# Patient Record
Sex: Male | Born: 1970
Health system: Southern US, Community
[De-identification: ages and names within clinical notes are randomized; demographics above are authoritative.]

## PROBLEM LIST (undated history)

## (undated) DIAGNOSIS — S82899A Other fracture of unspecified lower leg, initial encounter for closed fracture: Secondary | ICD-10-CM

## (undated) DIAGNOSIS — F324 Major depressive disorder, single episode, in partial remission: Secondary | ICD-10-CM

## (undated) DIAGNOSIS — R635 Abnormal weight gain: Secondary | ICD-10-CM

## (undated) HISTORY — DX: Abnormal weight gain: R63.5

## (undated) HISTORY — DX: Major depressive disorder, single episode, in partial remission: F32.4

## (undated) HISTORY — DX: Other fracture of unspecified lower leg, initial encounter for closed fracture: S82.899A

---

## 2004-11-10 HISTORY — PX: SHOULDER ARTHROSCOPY: SHX128

## 2011-12-17 ENCOUNTER — Ambulatory Visit: Payer: Self-pay | Admitting: Internal Medicine

## 2012-10-07 IMAGING — US ABDOMEN ULTRASOUND
1 series · 17 of 25 positions shown · non-contrast
Comparison: none

REASON FOR EXAM: complete  abn LFTs
COMMENTS:

[Series 1: abdomen ultrasound · 17 of 83 slices shown]
[im 1/83]
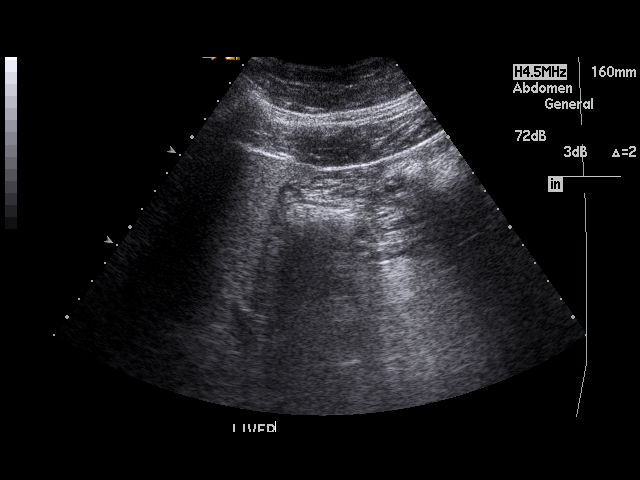
[im 7/83]
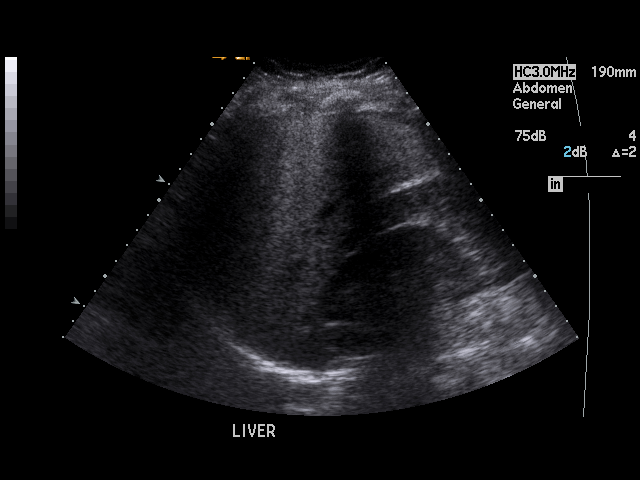
[im 11/83]
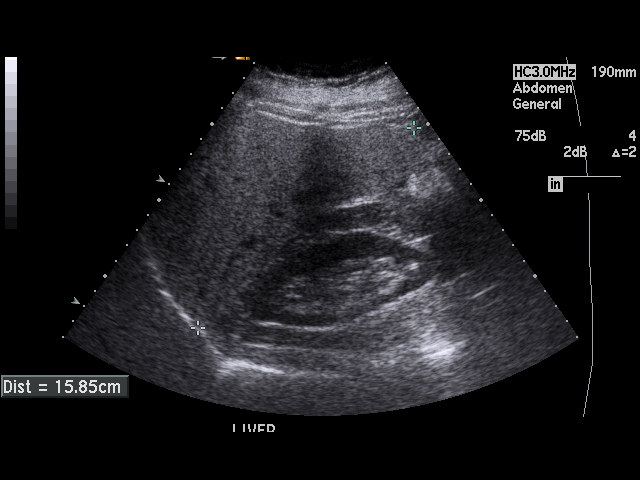
[im 18/83]
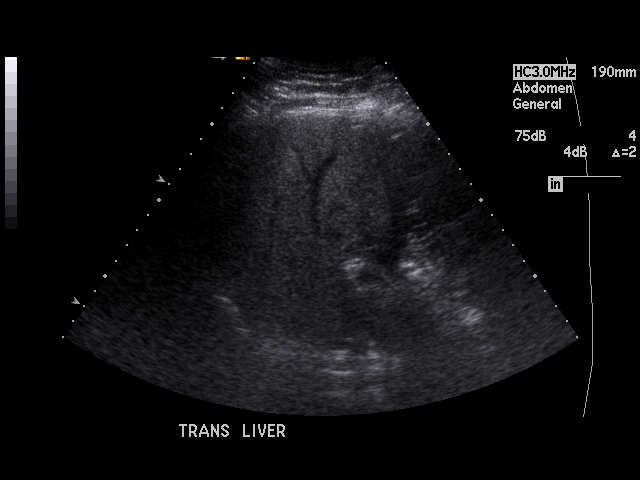
[im 21/83]
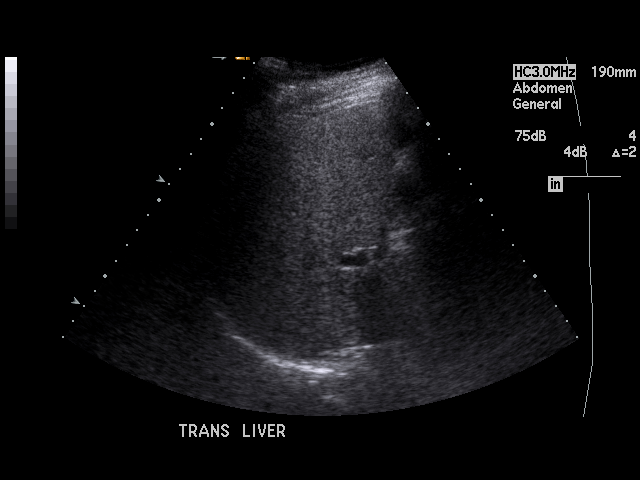
[im 28/83]
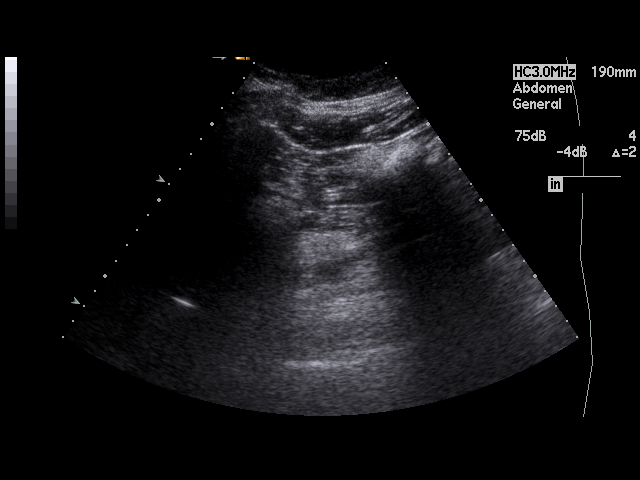
[im 31/83]
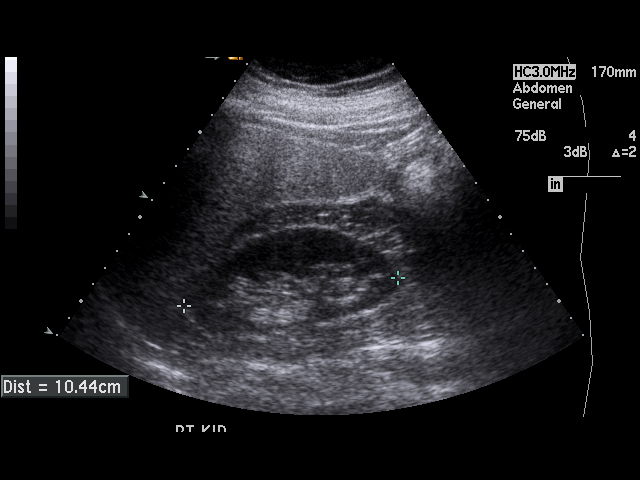
[im 38/83]
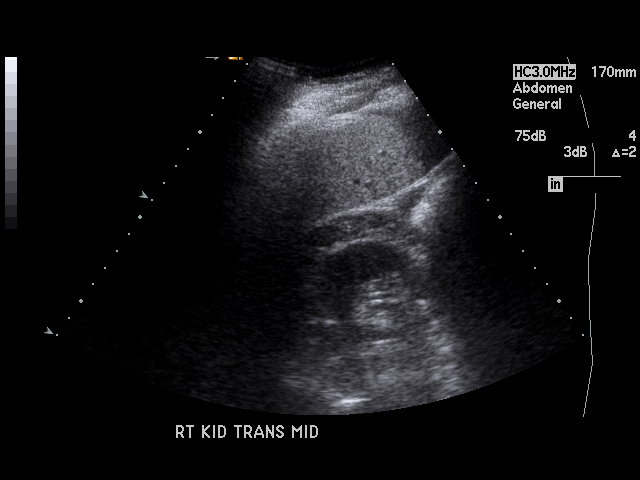
[im 42/83]
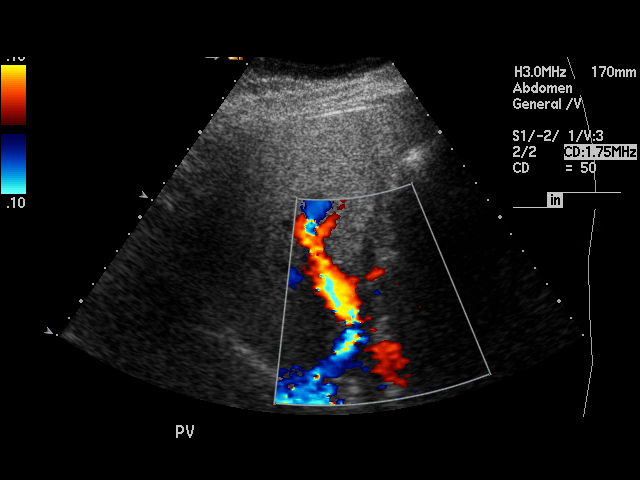
[im 45/83]
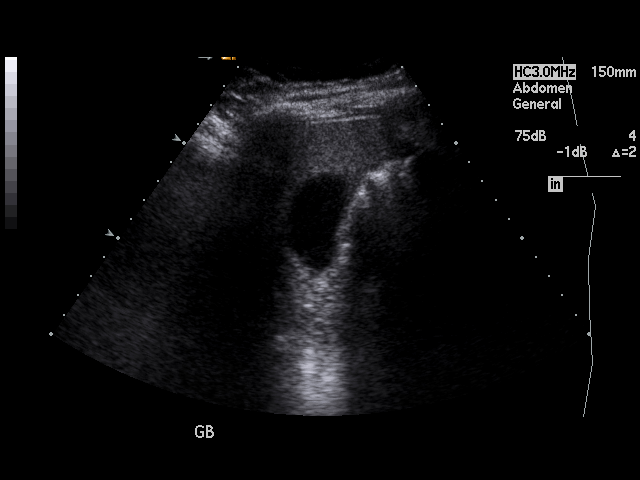
[im 52/83]
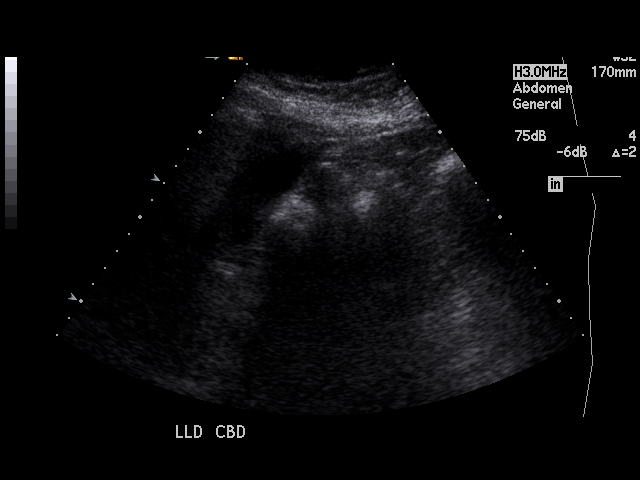
[im 55/83]
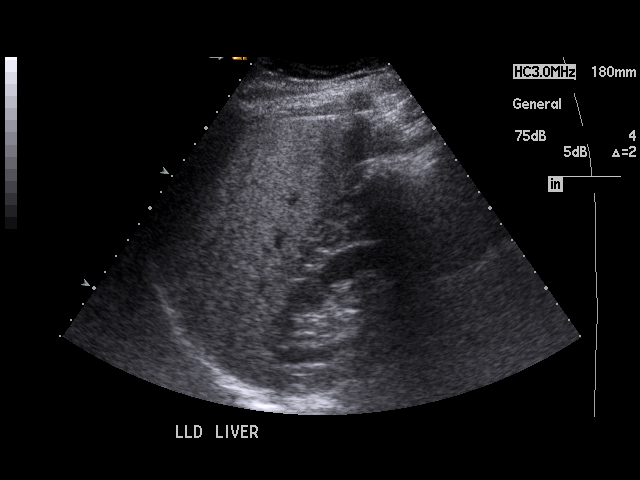
[im 62/83]
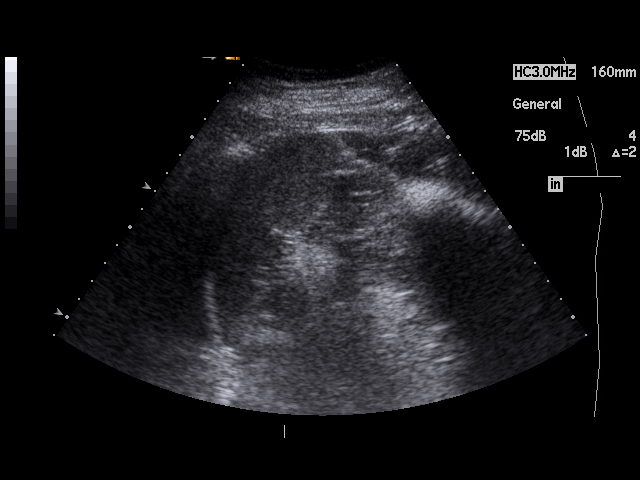
[im 65/83]
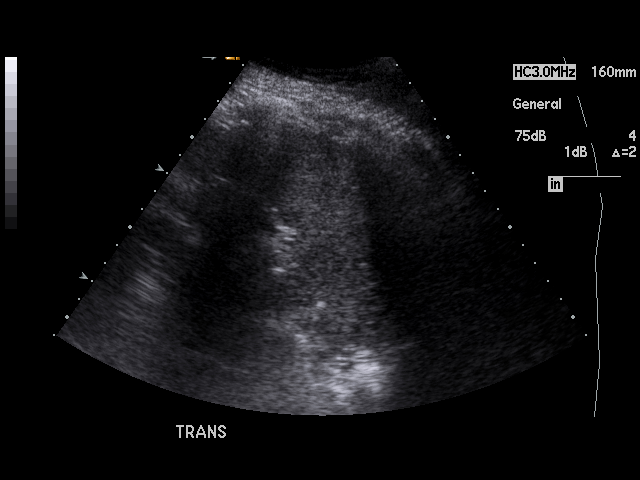
[im 72/83]
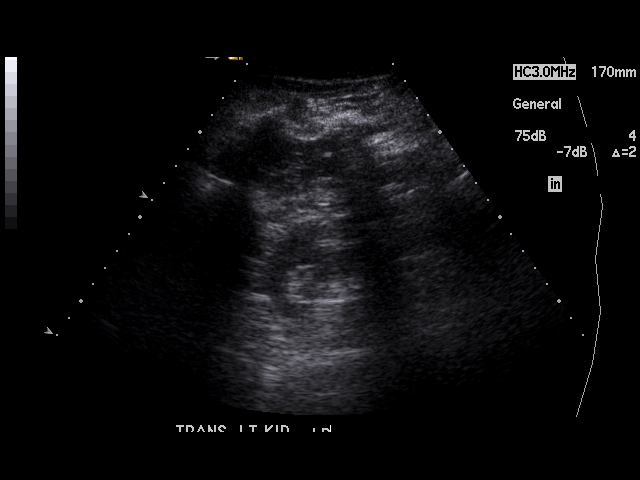
[im 76/83]
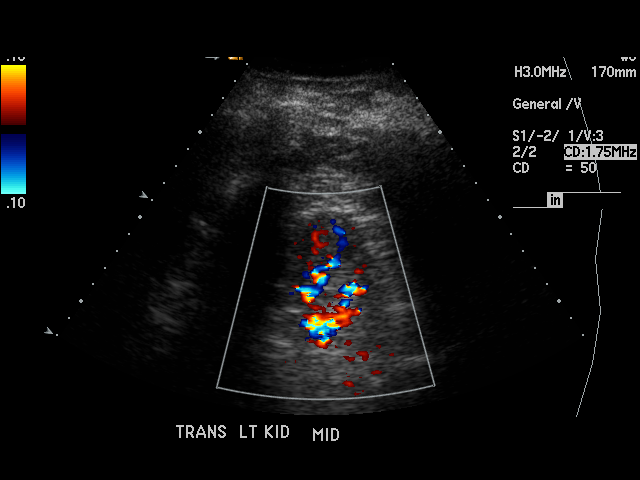
[im 83/83]
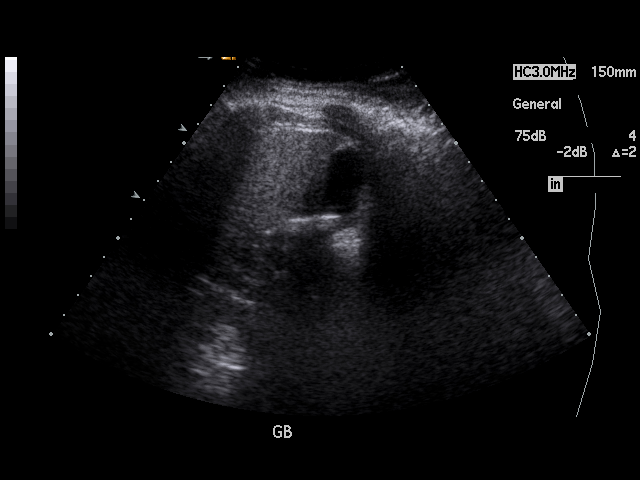

[17 of 25 positions shown; findings below may reference images not displayed]

PROCEDURE:     OSPINA - OSPINA ABDOMEN GENERAL SURVEY  - December 17, 2011 [DATE]

RESULT:     The liver is hyperechogenic consistent with fatty infiltration.
No focal hepatic mass is seen. The body of the pancreas is visualized and is
normal in appearance. The head and tail are obscured by bowel gas. Spleen
size is normal. The abdominal aorta is not visualized adequately for
evaluation. The proximal portion of the inferior vena cava is visualized and
is normal in appearance. No gallstones are seen. There is no thickening of
the gallbladder wall. Common bile duct measures 3.2 mm in diameter which is
within normal limits. The kidneys show no hydronephrosis. No ascites is seen.
IMPRESSION: 1. The liver appears hyperechogenic, suspicious for fatty infiltration.
2. No gallstones are seen.
3. The pancreas is partially obscured.
4. The abdominal aorta is not visualized on this exam and is apparently
obscured by bowel gas.

## 2013-09-27 LAB — BASIC METABOLIC PANEL
BUN: 13 mg/dL (ref 4–21)
CREATININE: 1.2 mg/dL (ref <=1.3)

## 2013-09-27 LAB — LIPID PANEL
CHOLESTEROL: 238 mg/dL — AB (ref 0–200)
HDL: 61 mg/dL (ref 35–70)
Triglycerides: 141 mg/dL (ref 40–160)

## 2013-09-27 LAB — TSH: TSH: 0.9 u[IU]/mL (ref <=5.90)

## 2015-04-11 ENCOUNTER — Telehealth: Payer: Self-pay

## 2015-04-11 DIAGNOSIS — R945 Abnormal results of liver function studies: Secondary | ICD-10-CM | POA: Insufficient documentation

## 2015-04-11 DIAGNOSIS — R635 Abnormal weight gain: Secondary | ICD-10-CM | POA: Insufficient documentation

## 2015-04-11 DIAGNOSIS — F324 Major depressive disorder, single episode, in partial remission: Secondary | ICD-10-CM

## 2015-04-11 DIAGNOSIS — R7989 Other specified abnormal findings of blood chemistry: Secondary | ICD-10-CM | POA: Insufficient documentation

## 2015-04-11 DIAGNOSIS — G4733 Obstructive sleep apnea (adult) (pediatric): Secondary | ICD-10-CM | POA: Insufficient documentation

## 2015-04-11 HISTORY — DX: Abnormal weight gain: R63.5

## 2015-04-11 HISTORY — DX: Major depressive disorder, single episode, in partial remission: F32.4

## 2015-04-11 MED ORDER — CITALOPRAM HYDROBROMIDE 20 MG PO TABS: 20.0000 mg | ORAL_TABLET | Freq: Every day | ORAL | Status: DC

## 2015-04-11 NOTE — Telephone Encounter (Signed)
[facility]

## 2015-05-22 ENCOUNTER — Encounter: Payer: Self-pay | Admitting: Internal Medicine

## 2015-07-03 ENCOUNTER — Encounter: Payer: Self-pay | Admitting: Internal Medicine

## 2015-11-03 ENCOUNTER — Other Ambulatory Visit: Payer: Self-pay | Admitting: Internal Medicine

## 2015-11-11 DIAGNOSIS — S82899A Other fracture of unspecified lower leg, initial encounter for closed fracture: Secondary | ICD-10-CM

## 2015-11-11 HISTORY — DX: Other fracture of unspecified lower leg, initial encounter for closed fracture: S82.899A

## 2016-04-29 ENCOUNTER — Other Ambulatory Visit: Payer: Self-pay | Admitting: Internal Medicine

## 2016-05-05 NOTE — Telephone Encounter (Signed)
Pt is coming in on 10/17 for cpe

## 2016-08-20 ENCOUNTER — Ambulatory Visit (INDEPENDENT_AMBULATORY_CARE_PROVIDER_SITE_OTHER): Payer: Federal, State, Local not specified - PPO | Admitting: Internal Medicine

## 2016-08-20 ENCOUNTER — Encounter: Payer: Self-pay | Admitting: Internal Medicine

## 2016-08-20 VITALS — BP 108/78 | HR 73 | Resp 16 | Ht 66.0 in | Wt 230.0 lb

## 2016-08-20 DIAGNOSIS — Z0000 Encounter for general adult medical examination without abnormal findings: Secondary | ICD-10-CM | POA: Diagnosis not present

## 2016-08-20 DIAGNOSIS — R945 Abnormal results of liver function studies: Secondary | ICD-10-CM

## 2016-08-20 DIAGNOSIS — R7989 Other specified abnormal findings of blood chemistry: Secondary | ICD-10-CM

## 2016-08-20 DIAGNOSIS — G4733 Obstructive sleep apnea (adult) (pediatric): Secondary | ICD-10-CM | POA: Diagnosis not present

## 2016-08-20 DIAGNOSIS — F324 Major depressive disorder, single episode, in partial remission: Secondary | ICD-10-CM

## 2016-08-20 DIAGNOSIS — Z23 Encounter for immunization: Secondary | ICD-10-CM

## 2016-08-20 LAB — POCT URINALYSIS DIPSTICK
BILIRUBIN UA: NEGATIVE
GLUCOSE UA: NEGATIVE
KETONES UA: NEGATIVE
LEUKOCYTES UA: NEGATIVE
NITRITE UA: NEGATIVE
PH UA: 6
Protein, UA: NEGATIVE
RBC UA: NEGATIVE
Spec Grav, UA: >=1.03
Urobilinogen, UA: 0.2

## 2016-08-20 MED ORDER — CITALOPRAM HYDROBROMIDE 20 MG PO TABS: 20.0000 mg | ORAL_TABLET | Freq: Every day | ORAL | 12 refills | Status: DC

## 2016-08-20 NOTE — Progress Notes (Signed)
Date:  08/20/2016   Name:  Bryan Sherman   DOB:  [birth year]   MRN:  [id number]   Chief Complaint: Annual Exam Bryan Sherman is a 45 y.o. male who presents today for his Complete Annual Exam. He feels well. He reports exercising regularly. He reports he is sleeping fairly well. His wife is expecting - @ 11 weeks.  Depression         This is a chronic problem.  The problem has been resolved since onset.  Associated symptoms include no fatigue, no appetite change, no myalgias and no headaches.  Past treatments include SSRIs - Selective serotonin reuptake inhibitors.  Compliance with treatment is good.  Previous treatment provided significant relief.  Sleep Apnea - doing well on CPAP.  Uses it regularly and last reading at 100% compliance.   Review of Systems  Constitutional: Negative for appetite change, chills, diaphoresis, fatigue and unexpected weight change.  HENT: Negative for hearing loss, tinnitus, trouble swallowing and voice change.   Eyes: Negative for visual disturbance.  Respiratory: Negative for choking, shortness of breath and wheezing.   Cardiovascular: Negative for chest pain, palpitations and leg swelling.  Gastrointestinal: Negative for abdominal pain, blood in stool, constipation and diarrhea.  Genitourinary: Negative for difficulty urinating, dysuria and frequency.  Musculoskeletal: Negative for arthralgias, back pain and myalgias.  Skin: Negative for color change and rash.  Neurological: Negative for dizziness, syncope and headaches.  Hematological: Negative for adenopathy.  Psychiatric/Behavioral: Positive for depression. Negative for dysphoric mood and sleep disturbance.    Patient Active Problem List   Diagnosis Date Noted  . Abnormal LFTs 04/11/2015  . Excessive body weight gain 04/11/2015  . Depression, major, single episode, in partial remission (HCC) 04/11/2015  . Obstructive apnea 04/11/2015    Prior to Admission medications   Medication Sig  Start Date End Date Taking? Authorizing Provider  citalopram (CELEXA) 20 MG tablet TAKE ONE TABLET BY MOUTH DAILY 04/29/16  Yes Reubin Milan, MD    No Known Allergies  Past Surgical History:  Procedure Laterality Date  . SHOULDER ARTHROSCOPY Left 2006    Social History  Substance Use Topics  . Smoking status: Former Games developer  . Smokeless tobacco: Current User    Types: Chew  . Alcohol use 2.4 oz/week    4 Standard drinks or equivalent per week     Comment: Social     Medication list has been reviewed and updated.   Physical Exam  Constitutional: He is oriented to person, place, and time. He appears well-developed and well-nourished.  HENT:  Head: Normocephalic.  Right Ear: Tympanic membrane, external ear and ear canal normal.  Left Ear: Tympanic membrane, external ear and ear canal normal.  Nose: Nose normal.  Mouth/Throat: Uvula is midline and oropharynx is clear and moist.  Eyes: Conjunctivae and EOM are normal. Pupils are equal, round, and reactive to light.  Neck: Normal range of motion. Neck supple. Carotid bruit is not present. No thyromegaly present.  Cardiovascular: Normal rate, regular rhythm, normal heart sounds and intact distal pulses.   Pulmonary/Chest: Effort normal and breath sounds normal. He has no wheezes. Right breast exhibits no mass. Left breast exhibits no mass.  Abdominal: Soft. Normal appearance and bowel sounds are normal. There is no hepatosplenomegaly. There is no tenderness.  Musculoskeletal: Normal range of motion.  Lymphadenopathy:    He has no cervical adenopathy.  Neurological: He is alert and oriented to person, place, and time. He has normal reflexes.  Skin: Skin is warm, dry and intact.  Psychiatric: He has a normal mood and affect. His speech is normal and behavior is normal. Judgment and thought content normal.  Nursing note and vitals reviewed.   BP 108/78   Pulse 73   Ht 5\' 6"  (1.676 m)   Wt 221 lb (100.2 kg)   BMI 35.67 kg/m    Assessment and Plan: 1. Annual physical exam Work on exercise and diet for weight loss - Lipid panel - POCT urinalysis dipstick  2. Abnormal LFTs - Comprehensive metabolic panel  3. Depression, major, single episode, in partial remission (HCC) Doing well - TSH - citalopram (CELEXA) 20 MG tablet; Take 1 tablet (20 mg total) by mouth daily.  Dispense: 30 tablet; Refill: 12  4. Obstructive apnea Excellent compliance and response - CBC with Differential/Platelet  5. Need for diphtheria-tetanus-pertussis (Tdap) vaccine - Tdap vaccine greater than or equal to 7yo IM   Bari EdwardLaura Alyia Lacerte, MD [facility]  08/20/2016

## 2016-08-20 NOTE — Patient Instructions (Addendum)
DASH Eating Plan DASH stands for "Dietary Approaches to Stop Hypertension." The DASH eating plan is a healthy eating plan that has been shown to reduce high blood pressure (hypertension). Additional health benefits may include reducing the risk of type 2 diabetes mellitus, heart disease, and stroke. The DASH eating plan may also help with weight loss. WHAT DO I NEED TO KNOW ABOUT THE DASH EATING PLAN? For the DASH eating plan, you will follow these general guidelines:  Choose foods with a percent daily value for sodium of less than 5% (as listed on the food label).  Use salt-free seasonings or herbs instead of table salt or sea salt.  Check with your health care provider or pharmacist before using salt substitutes.  Eat lower-sodium products, often labeled as "lower sodium" or "no salt added."  Eat fresh foods.  Eat more vegetables, fruits, and low-fat dairy products.  Choose whole grains. Look for the word "whole" as the first word in the ingredient list.  Choose fish and skinless chicken or turkey more often than red meat. Limit fish, poultry, and meat to 6 oz (170 g) each day.  Limit sweets, desserts, sugars, and sugary drinks.  Choose heart-healthy fats.  Limit cheese to 1 oz (28 g) per day.  Eat more home-cooked food and less restaurant, buffet, and fast food.  Limit fried foods.  Cook foods using methods other than frying.  Limit canned vegetables. If you do use them, rinse them well to decrease the sodium.  When eating at a restaurant, ask that your food be prepared with less salt, or no salt if possible. WHAT FOODS CAN I EAT? Seek help from a dietitian for individual calorie needs. Grains Whole grain or whole wheat bread. Brown rice. Whole grain or whole wheat pasta. Quinoa, bulgur, and whole grain cereals. Low-sodium cereals. Corn or whole wheat flour tortillas. Whole grain cornbread. Whole grain crackers. Low-sodium crackers. Vegetables Fresh or frozen vegetables  (raw, steamed, roasted, or grilled). Low-sodium or reduced-sodium tomato and vegetable juices. Low-sodium or reduced-sodium tomato sauce and paste. Low-sodium or reduced-sodium canned vegetables.  Fruits All fresh, canned (in natural juice), or frozen fruits. Meat and Other Protein Products Ground beef (85% or leaner), grass-fed beef, or beef trimmed of fat. Skinless chicken or turkey. Ground chicken or turkey. Pork trimmed of fat. All fish and seafood. Eggs. Dried beans, peas, or lentils. Unsalted nuts and seeds. Unsalted canned beans. Dairy Low-fat dairy products, such as skim or 1% milk, 2% or reduced-fat cheeses, low-fat ricotta or cottage cheese, or plain low-fat yogurt. Low-sodium or reduced-sodium cheeses. Fats and Oils Tub margarines without trans fats. Light or reduced-fat mayonnaise and salad dressings (reduced sodium). Avocado. Safflower, olive, or canola oils. Natural peanut or almond butter. Other Unsalted popcorn and pretzels. The items listed above may not be a complete list of recommended foods or beverages. Contact your dietitian for more options. WHAT FOODS ARE NOT RECOMMENDED? Grains White bread. White pasta. White rice. Refined cornbread. Bagels and croissants. Crackers that contain trans fat. Vegetables Creamed or fried vegetables. Vegetables in a cheese sauce. Regular canned vegetables. Regular canned tomato sauce and paste. Regular tomato and vegetable juices. Fruits Dried fruits. Canned fruit in light or heavy syrup. Fruit juice. Meat and Other Protein Products Fatty cuts of meat. Ribs, chicken wings, bacon, sausage, bologna, salami, chitterlings, fatback, hot dogs, bratwurst, and packaged luncheon meats. Salted nuts and seeds. Canned beans with salt. Dairy Whole or 2% milk, cream, half-and-half, and cream cheese. Whole-fat or sweetened yogurt. Full-fat   cheeses or blue cheese. Nondairy creamers and whipped toppings. Processed cheese, cheese spreads, or cheese  curds. Condiments Onion and garlic salt, seasoned salt, table salt, and sea salt. Canned and packaged gravies. Worcestershire sauce. Tartar sauce. Barbecue sauce. Teriyaki sauce. Soy sauce, including reduced sodium. Steak sauce. Fish sauce. Oyster sauce. Cocktail sauce. Horseradish. Ketchup and mustard. Meat flavorings and tenderizers. Bouillon cubes. Hot sauce. Tabasco sauce. Marinades. Taco seasonings. Relishes. Fats and Oils Butter, stick margarine, lard, shortening, ghee, and bacon fat. Coconut, palm kernel, or palm oils. Regular salad dressings. Other Pickles and olives. Salted popcorn and pretzels. The items listed above may not be a complete list of foods and beverages to avoid. Contact your dietitian for more information. WHERE CAN I FIND MORE INFORMATION? National Heart, Lung, and Blood Institute: www.nhlbi.nih.gov/health/health-topics/topics/dash/   This information is not intended to replace advice given to you by your health care provider. Make sure you discuss any questions you have with your health care provider.   Document Released: 10/16/2011 Document Revised: 11/17/2014 Document Reviewed: 08/31/2013 Elsevier Interactive Patient Education 2016 Elsevier Inc. Tdap Vaccine (Tetanus, Diphtheria and Pertussis): What You Need to Know 1. Why get vaccinated? Tetanus, diphtheria and pertussis are very serious diseases. Tdap vaccine can protect us from these diseases. And, Tdap vaccine given to pregnant women can protect newborn babies against pertussis. TETANUS (Lockjaw) is rare in the [place] today. It causes painful muscle tightening and stiffness, usually all over the body.  It can lead to tightening of muscles in the head and neck so you can't open your mouth, swallow, or sometimes even breathe. Tetanus kills about 1 out of 10 people who are infected even after receiving the best medical care. DIPHTHERIA is also rare in the [place] today. It can cause a thick coating to  form in the back of the throat.  It can lead to breathing problems, heart failure, paralysis, and death. PERTUSSIS (Whooping Cough) causes severe coughing spells, which can cause difficulty breathing, vomiting and disturbed sleep.  It can also lead to weight loss, incontinence, and rib fractures. Up to 2 in 100 adolescents and 5 in 100 adults with pertussis are hospitalized or have complications, which could include pneumonia or death. These diseases are caused by bacteria. Diphtheria and pertussis are spread from person to person through secretions from coughing or sneezing. Tetanus enters the body through cuts, scratches, or wounds. Before vaccines, as many as 200,000 cases of diphtheria, 200,000 cases of pertussis, and hundreds of cases of tetanus, were reported in the [place] each year. Since vaccination began, reports of cases for tetanus and diphtheria have dropped by about 99% and for pertussis by about 80%. 2. Tdap vaccine Tdap vaccine can protect adolescents and adults from tetanus, diphtheria, and pertussis. One dose of Tdap is routinely given at age 11 or 12. People who did not get Tdap at that age should get it as soon as possible. Tdap is especially important for healthcare professionals and anyone having close contact with a baby younger than 12 months. Pregnant women should get a dose of Tdap during every pregnancy, to protect the newborn from pertussis. Infants are most at risk for severe, life-threatening complications from pertussis. Another vaccine, called Td, protects against tetanus and diphtheria, but not pertussis. A Td booster should be given every 10 years. Tdap may be given as one of these boosters if you have never gotten Tdap before. Tdap may also be given after a severe cut or burn to prevent tetanus infection.   Your doctor or the person giving you the vaccine can give you more information. Tdap may safely be given at the same time as other vaccines. 3. Some people  should not get this vaccine  A person who has ever had a life-threatening allergic reaction after a previous dose of any diphtheria, tetanus or pertussis containing vaccine, OR has a severe allergy to any part of this vaccine, should not get Tdap vaccine. Tell the person giving the vaccine about any severe allergies.  Anyone who had coma or long repeated seizures within 7 days after a childhood dose of DTP or DTaP, or a previous dose of Tdap, should not get Tdap, unless a cause other than the vaccine was found. They can still get Td.  Talk to your doctor if you:  have seizures or another nervous system problem,  had severe pain or swelling after any vaccine containing diphtheria, tetanus or pertussis,  ever had a condition called Guillain-Barr Syndrome (GBS),  aren't feeling well on the day the shot is scheduled. 4. Risks With any medicine, including vaccines, there is a chance of side effects. These are usually mild and go away on their own. Serious reactions are also possible but are rare. Most people who get Tdap vaccine do not have any problems with it. Mild problems following Tdap (Did not interfere with activities)  Pain where the shot was given (about 3 in 4 adolescents or 2 in 3 adults)  Redness or swelling where the shot was given (about 1 person in 5)  Mild fever of at least 100.4F (up to about 1 in 25 adolescents or 1 in 100 adults)  Headache (about 3 or 4 people in 10)  Tiredness (about 1 person in 3 or 4)  Nausea, vomiting, diarrhea, stomach ache (up to 1 in 4 adolescents or 1 in 10 adults)  Chills, sore joints (about 1 person in 10)  Body aches (about 1 person in 3 or 4)  Rash, swollen glands (uncommon) Moderate problems following Tdap (Interfered with activities, but did not require medical attention)  Pain where the shot was given (up to 1 in 5 or 6)  Redness or swelling where the shot was given (up to about 1 in 16 adolescents or 1 in 12 adults)  Fever  over 102F (about 1 in 100 adolescents or 1 in 250 adults)  Headache (about 1 in 7 adolescents or 1 in 10 adults)  Nausea, vomiting, diarrhea, stomach ache (up to 1 or 3 people in 100)  Swelling of the entire arm where the shot was given (up to about 1 in 500). Severe problems following Tdap (Unable to perform usual activities; required medical attention)  Swelling, severe pain, bleeding and redness in the arm where the shot was given (rare). Problems that could happen after any vaccine:  People sometimes faint after a medical procedure, including vaccination. Sitting or lying down for about 15 minutes can help prevent fainting, and injuries caused by a fall. Tell your doctor if you feel dizzy, or have vision changes or ringing in the ears.  Some people get severe pain in the shoulder and have difficulty moving the arm where a shot was given. This happens very rarely.  Any medication can cause a severe allergic reaction. Such reactions from a vaccine are very rare, estimated at fewer than 1 in a million doses, and would happen within a few minutes to a few hours after the vaccination. As with any medicine, there is a very remote chance of   a vaccine causing a serious injury or death. The safety of vaccines is always being monitored. For more information, visit: www.cdc.gov/vaccinesafety/ 5. What if there is a serious problem? What should I look for?  Look for anything that concerns you, such as signs of a severe allergic reaction, very high fever, or unusual behavior.  Signs of a severe allergic reaction can include hives, swelling of the face and throat, difficulty breathing, a fast heartbeat, dizziness, and weakness. These would usually start a few minutes to a few hours after the vaccination. What should I do?  If you think it is a severe allergic reaction or other emergency that can't wait, call 9-1-1 or get the person to the nearest hospital. Otherwise, call your doctor.  Afterward,  the reaction should be reported to the Vaccine Adverse Event Reporting System (VAERS). Your doctor might file this report, or you can do it yourself through the VAERS web site at www.vaers.hhs.gov, or by calling 1-800-822-7967. VAERS does not give medical advice.  6. The National Vaccine Injury Compensation Program The National Vaccine Injury Compensation Program (VICP) is a federal program that was created to compensate people who may have been injured by certain vaccines. Persons who believe they may have been injured by a vaccine can learn about the program and about filing a claim by calling 1-800-338-2382 or visiting the VICP website at www.hrsa.gov/vaccinecompensation. There is a time limit to file a claim for compensation. 7. How can I learn more?  Ask your doctor. He or she can give you the vaccine package insert or suggest other sources of information.  Call your local or state health department.  Contact the Centers for Disease Control and Prevention (CDC):  Call 1-800-232-4636 (1-800-CDC-INFO) or  Visit CDC's website at www.cdc.gov/vaccines CDC Tdap Vaccine VIS (01/03/14)   This information is not intended to replace advice given to you by your health care provider. Make sure you discuss any questions you have with your health care provider.   Document Released: 04/27/2012 Document Revised: 11/17/2014 Document Reviewed: 02/08/2014 Elsevier Interactive Patient Education 2016 Elsevier Inc.  

## 2016-08-21 LAB — COMPREHENSIVE METABOLIC PANEL
ALT: 56 IU/L — ABNORMAL HIGH (ref 0–44)
AST: 31 IU/L (ref 0–40)
Albumin/Globulin Ratio: 2.1 (ref 1.2–2.2)
Albumin: 4.6 g/dL (ref 3.5–5.5)
Alkaline Phosphatase: 61 IU/L (ref 39–117)
BUN/Creatinine Ratio: 10 (ref 9–20)
BUN: 12 mg/dL (ref 6–24)
Bilirubin Total: 0.5 mg/dL (ref 0.0–1.2)
CALCIUM: 9.1 mg/dL (ref 8.7–10.2)
CO2: 29 mmol/L (ref 18–29)
CREATININE: 1.17 mg/dL (ref 0.76–1.27)
Chloride: 99 mmol/L (ref 96–106)
GFR calc Af Amer: 87 mL/min/{1.73_m2} (ref >59)
GFR, EST NON AFRICAN AMERICAN: 75 mL/min/{1.73_m2} (ref >59)
GLUCOSE: 89 mg/dL (ref 65–99)
Globulin, Total: 2.2 g/dL (ref 1.5–4.5)
Potassium: 4.7 mmol/L (ref 3.5–5.2)
Sodium: 143 mmol/L (ref 134–144)
Total Protein: 6.8 g/dL (ref 6.0–8.5)

## 2016-08-21 LAB — CBC WITH DIFFERENTIAL/PLATELET
BASOS ABS: 0 10*3/uL (ref 0.0–0.2)
Basos: 1 %
EOS (ABSOLUTE): 0.3 10*3/uL (ref 0.0–0.4)
EOS: 4 %
HEMOGLOBIN: 15.9 g/dL (ref 12.6–17.7)
Hematocrit: 45.6 % (ref 37.5–51.0)
IMMATURE GRANULOCYTES: 0 %
Immature Grans (Abs): 0 10*3/uL (ref 0.0–0.1)
LYMPHS ABS: 2.1 10*3/uL (ref 0.7–3.1)
Lymphs: 25 %
MCH: 32.1 pg (ref 26.6–33.0)
MCHC: 34.9 g/dL (ref 31.5–35.7)
MCV: 92 fL (ref 79–97)
Monocytes Absolute: 0.5 10*3/uL (ref 0.1–0.9)
Monocytes: 6 %
NEUTROS PCT: 64 %
Neutrophils Absolute: 5.2 10*3/uL (ref 1.4–7.0)
Platelets: 185 10*3/uL (ref 150–379)
RBC: 4.96 x10E6/uL (ref 4.14–5.80)
RDW: 14 % (ref 12.3–15.4)
WBC: 8.2 10*3/uL (ref 3.4–10.8)

## 2016-08-21 LAB — LIPID PANEL
CHOLESTEROL TOTAL: 193 mg/dL (ref 100–199)
Chol/HDL Ratio: 3.4 ratio units (ref 0.0–5.0)
HDL: 57 mg/dL (ref >39)
LDL CALC: 112 mg/dL — AB (ref 0–99)
TRIGLYCERIDES: 118 mg/dL (ref 0–149)
VLDL CHOLESTEROL CAL: 24 mg/dL (ref 5–40)

## 2016-08-21 LAB — TSH: TSH: 1.02 u[IU]/mL (ref 0.450–4.500)

## 2017-01-06 DIAGNOSIS — E669 Obesity, unspecified: Secondary | ICD-10-CM | POA: Diagnosis not present

## 2017-01-06 DIAGNOSIS — G4733 Obstructive sleep apnea (adult) (pediatric): Secondary | ICD-10-CM | POA: Diagnosis not present

## 2017-01-09 DIAGNOSIS — M791 Myalgia: Secondary | ICD-10-CM | POA: Diagnosis not present

## 2017-01-09 DIAGNOSIS — M542 Cervicalgia: Secondary | ICD-10-CM | POA: Diagnosis not present

## 2017-08-21 ENCOUNTER — Encounter: Payer: Self-pay | Admitting: Internal Medicine

## 2017-08-21 ENCOUNTER — Ambulatory Visit (INDEPENDENT_AMBULATORY_CARE_PROVIDER_SITE_OTHER): Payer: Federal, State, Local not specified - PPO | Admitting: Internal Medicine

## 2017-08-21 VITALS — BP 116/68 | HR 68 | Ht 67.0 in | Wt 234.0 lb

## 2017-08-21 DIAGNOSIS — R945 Abnormal results of liver function studies: Secondary | ICD-10-CM | POA: Diagnosis not present

## 2017-08-21 DIAGNOSIS — G4733 Obstructive sleep apnea (adult) (pediatric): Secondary | ICD-10-CM

## 2017-08-21 DIAGNOSIS — R7989 Other specified abnormal findings of blood chemistry: Secondary | ICD-10-CM

## 2017-08-21 DIAGNOSIS — F324 Major depressive disorder, single episode, in partial remission: Secondary | ICD-10-CM | POA: Diagnosis not present

## 2017-08-21 DIAGNOSIS — Z0000 Encounter for general adult medical examination without abnormal findings: Secondary | ICD-10-CM | POA: Diagnosis not present

## 2017-08-21 LAB — POCT URINALYSIS DIPSTICK
Bilirubin, UA: NEGATIVE
Glucose, UA: NEGATIVE
Ketones, UA: NEGATIVE
Leukocytes, UA: NEGATIVE
NITRITE UA: NEGATIVE
PH UA: 6 (ref 5.0–8.0)
PROTEIN UA: NEGATIVE
RBC UA: NEGATIVE
Spec Grav, UA: 1.025 (ref 1.010–1.025)
UROBILINOGEN UA: 0.2 U/dL

## 2017-08-21 MED ORDER — CITALOPRAM HYDROBROMIDE 20 MG PO TABS: 20.0000 mg | ORAL_TABLET | Freq: Every day | ORAL | 3 refills | Status: DC

## 2017-08-21 NOTE — Patient Instructions (Signed)
DASH Eating Plan DASH stands for "Dietary Approaches to Stop Hypertension." The DASH eating plan is a healthy eating plan that has been shown to reduce high blood pressure (hypertension). It may also reduce your risk for type 2 diabetes, heart disease, and stroke. The DASH eating plan may also help with weight loss. What are tips for following this plan? General guidelines  Avoid eating more than 2,300 mg (milligrams) of salt (sodium) a day. If you have hypertension, you may need to reduce your sodium intake to 1,500 mg a day.  Limit alcohol intake to no more than 1 drink a day for nonpregnant women and 2 drinks a day for men. One drink equals 12 oz of beer, 5 oz of wine, or 1 oz of hard liquor.  Work with your health care provider to maintain a healthy body weight or to lose weight. Ask what an ideal weight is for you.  Get at least 30 minutes of exercise that causes your heart to beat faster (aerobic exercise) most days of the week. Activities may include walking, swimming, or biking.  Work with your health care provider or diet and nutrition specialist (dietitian) to adjust your eating plan to your individual calorie needs. Reading food labels  Check food labels for the amount of sodium per serving. Choose foods with less than 5 percent of the Daily Value of sodium. Generally, foods with less than 300 mg of sodium per serving fit into this eating plan.  To find whole grains, look for the word "whole" as the first word in the ingredient list. Shopping  Buy products labeled as "low-sodium" or "no salt added."  Buy fresh foods. Avoid canned foods and premade or frozen meals. Cooking  Avoid adding salt when cooking. Use salt-free seasonings or herbs instead of table salt or sea salt. Check with your health care provider or pharmacist before using salt substitutes.  Do not fry foods. Cook foods using healthy methods such as baking, boiling, grilling, and broiling instead.  Cook with  heart-healthy oils, such as olive, canola, soybean, or sunflower oil. Meal planning   Eat a balanced diet that includes: ? 5 or more servings of fruits and vegetables each day. At each meal, try to fill half of your plate with fruits and vegetables. ? Up to 6-8 servings of whole grains each day. ? Less than 6 oz of lean meat, poultry, or fish each day. A 3-oz serving of meat is about the same size as a deck of cards. One egg equals 1 oz. ? 2 servings of low-fat dairy each day. ? A serving of nuts, seeds, or beans 5 times each week. ? Heart-healthy fats. Healthy fats called Omega-3 fatty acids are found in foods such as flaxseeds and coldwater fish, like sardines, salmon, and mackerel.  Limit how much you eat of the following: ? Canned or prepackaged foods. ? Food that is high in trans fat, such as fried foods. ? Food that is high in saturated fat, such as fatty meat. ? Sweets, desserts, sugary drinks, and other foods with added sugar. ? Full-fat dairy products.  Do not salt foods before eating.  Try to eat at least 2 vegetarian meals each week.  Eat more home-cooked food and less restaurant, buffet, and fast food.  When eating at a restaurant, ask that your food be prepared with less salt or no salt, if possible. What foods are recommended? The items listed may not be a complete list. Talk with your dietitian about what   dietary choices are best for you. Grains Whole-grain or whole-wheat bread. Whole-grain or whole-wheat pasta. Brown rice. Oatmeal. Quinoa. Bulgur. Whole-grain and low-sodium cereals. Pita bread. Low-fat, low-sodium crackers. Whole-wheat flour tortillas. Vegetables Fresh or frozen vegetables (raw, steamed, roasted, or grilled). Low-sodium or reduced-sodium tomato and vegetable juice. Low-sodium or reduced-sodium tomato sauce and tomato paste. Low-sodium or reduced-sodium canned vegetables. Fruits All fresh, dried, or frozen fruit. Canned fruit in natural juice (without  added sugar). Meat and other protein foods Skinless chicken or turkey. Ground chicken or turkey. Pork with fat trimmed off. Fish and seafood. Egg whites. Dried beans, peas, or lentils. Unsalted nuts, nut butters, and seeds. Unsalted canned beans. Lean cuts of beef with fat trimmed off. Low-sodium, lean deli meat. Dairy Low-fat (1%) or fat-free (skim) milk. Fat-free, low-fat, or reduced-fat cheeses. Nonfat, low-sodium ricotta or cottage cheese. Low-fat or nonfat yogurt. Low-fat, low-sodium cheese. Fats and oils Soft margarine without trans fats. Vegetable oil. Low-fat, reduced-fat, or light mayonnaise and salad dressings (reduced-sodium). Canola, safflower, olive, soybean, and sunflower oils. Avocado. Seasoning and other foods Herbs. Spices. Seasoning mixes without salt. Unsalted popcorn and pretzels. Fat-free sweets. What foods are not recommended? The items listed may not be a complete list. Talk with your dietitian about what dietary choices are best for you. Grains Baked goods made with fat, such as croissants, muffins, or some breads. Dry pasta or rice meal packs. Vegetables Creamed or fried vegetables. Vegetables in a cheese sauce. Regular canned vegetables (not low-sodium or reduced-sodium). Regular canned tomato sauce and paste (not low-sodium or reduced-sodium). Regular tomato and vegetable juice (not low-sodium or reduced-sodium). Pickles. Olives. Fruits Canned fruit in a light or heavy syrup. Fried fruit. Fruit in cream or butter sauce. Meat and other protein foods Fatty cuts of meat. Ribs. Fried meat. Bacon. Sausage. Bologna and other processed lunch meats. Salami. Fatback. Hotdogs. Bratwurst. Salted nuts and seeds. Canned beans with added salt. Canned or smoked fish. Whole eggs or egg yolks. Chicken or turkey with skin. Dairy Whole or 2% milk, cream, and half-and-half. Whole or full-fat cream cheese. Whole-fat or sweetened yogurt. Full-fat cheese. Nondairy creamers. Whipped toppings.  Processed cheese and cheese spreads. Fats and oils Butter. Stick margarine. Lard. Shortening. Ghee. Bacon fat. Tropical oils, such as coconut, palm kernel, or palm oil. Seasoning and other foods Salted popcorn and pretzels. Onion salt, garlic salt, seasoned salt, table salt, and sea salt. Worcestershire sauce. Tartar sauce. Barbecue sauce. Teriyaki sauce. Soy sauce, including reduced-sodium. Steak sauce. Canned and packaged gravies. Fish sauce. Oyster sauce. Cocktail sauce. Horseradish that you find on the shelf. Ketchup. Mustard. Meat flavorings and tenderizers. Bouillon cubes. Hot sauce and Tabasco sauce. Premade or packaged marinades. Premade or packaged taco seasonings. Relishes. Regular salad dressings. Where to find more information:  National Heart, Lung, and Blood Institute: www.nhlbi.nih.gov  American Heart Association: www.heart.org Summary  The DASH eating plan is a healthy eating plan that has been shown to reduce high blood pressure (hypertension). It may also reduce your risk for type 2 diabetes, heart disease, and stroke.  With the DASH eating plan, you should limit salt (sodium) intake to 2,300 mg a day. If you have hypertension, you may need to reduce your sodium intake to 1,500 mg a day.  When on the DASH eating plan, aim to eat more fresh fruits and vegetables, whole grains, lean proteins, low-fat dairy, and heart-healthy fats.  Work with your health care provider or diet and nutrition specialist (dietitian) to adjust your eating plan to your individual   calorie needs. This information is not intended to replace advice given to you by your health care provider. Make sure you discuss any questions you have with your health care provider. Document Released: 10/16/2011 Document Revised: 10/20/2016 Document Reviewed: 10/20/2016 Elsevier Interactive Patient Education  2017 Elsevier Inc.  

## 2017-08-21 NOTE — Progress Notes (Signed)
Date:  08/21/2017   Name:  Bryan Sherman   DOB:  [birth year]   MRN:  [id number]   Chief Complaint: Annual Exam Bryan Sherman is a 46 y.o. male who presents today for his Complete Annual Exam. He feels well. He reports exercising walking the baby but no structured program. He reports he is sleeping well. He has a 5 mo daughter at home.  Depression         This is a chronic problem.  The problem occurs rarely.  Associated symptoms include no fatigue, no appetite change, no myalgias and no headaches.  OSA - still using CPAP nightly and still seeing sleep specialist yearly. He sleeps well and has good energy during the day.  No somnolence or headache.    Review of Systems  Constitutional: Negative for appetite change, chills, diaphoresis, fatigue and unexpected weight change.  HENT: Negative for hearing loss, tinnitus, trouble swallowing and voice change.   Eyes: Negative for visual disturbance.  Respiratory: Negative for choking, shortness of breath and wheezing.   Cardiovascular: Negative for chest pain, palpitations and leg swelling.  Gastrointestinal: Negative for abdominal pain, blood in stool, constipation and diarrhea.  Genitourinary: Negative for difficulty urinating, dysuria and frequency.  Musculoskeletal: Negative for arthralgias, back pain and myalgias.  Skin: Negative for color change and rash.  Neurological: Negative for dizziness, syncope and headaches.  Hematological: Negative for adenopathy.  Psychiatric/Behavioral: Positive for depression. Negative for dysphoric mood and sleep disturbance.    Patient Active Problem List   Diagnosis Date Noted  . Abnormal LFTs 04/11/2015  . Excessive body weight gain 04/11/2015  . Depression, major, single episode, in partial remission (HCC) 04/11/2015  . Obstructive apnea 04/11/2015    Prior to Admission medications   Medication Sig Start Date End Date Taking? Authorizing Provider  citalopram (CELEXA) 20 MG tablet Take  1 tablet (20 mg total) by mouth daily. 08/20/16  Yes Reubin Milan, MD    No Known Allergies  Past Surgical History:  Procedure Laterality Date  . SHOULDER ARTHROSCOPY Left 2006    Social History  Substance Use Topics  . Smoking status: Former Games developer  . Smokeless tobacco: Current User    Types: Chew  . Alcohol use 2.4 oz/week    4 Standard drinks or equivalent per week     Comment: Social     Medication list has been reviewed and updated.  PHQ 2/9 Scores 08/21/2017 08/20/2016  PHQ - 2 Score 0 0  PHQ- 9 Score 0 -    Physical Exam  Constitutional: He is oriented to person, place, and time. He appears well-developed and well-nourished.  HENT:  Head: Normocephalic.  Right Ear: Tympanic membrane, external ear and ear canal normal.  Left Ear: Tympanic membrane, external ear and ear canal normal.  Nose: Nose normal.  Mouth/Throat: Uvula is midline and oropharynx is clear and moist.  Eyes: Pupils are equal, round, and reactive to light. Conjunctivae and EOM are normal.  Neck: Normal range of motion. Neck supple. Carotid bruit is not present. No thyromegaly present.  Cardiovascular: Normal rate, regular rhythm, normal heart sounds and intact distal pulses.   Pulmonary/Chest: Effort normal and breath sounds normal. He has no wheezes. Right breast exhibits no mass. Left breast exhibits no mass.  Abdominal: Soft. Normal appearance and bowel sounds are normal. There is no hepatosplenomegaly. There is no tenderness.  Musculoskeletal: Normal range of motion. He exhibits no edema or tenderness.  Lymphadenopathy:    He has  no cervical adenopathy.  Neurological: He is alert and oriented to person, place, and time. He has normal reflexes.  Skin: Skin is warm, dry and intact.  Psychiatric: He has a normal mood and affect. His speech is normal and behavior is normal. Judgment and thought content normal.  Nursing note and vitals reviewed.   BP 116/68   Pulse 68   Ht  (1.702 m)    Wt 234 lb (106.1 kg)   SpO2 96%   BMI 36.65 kg/m   Assessment and Plan: 1. Annual physical exam Normal exam except for weight Discussed regular exercise and dietary changes - CBC with Differential/Platelet - Lipid panel - POCT urinalysis dipstick  2. Depression, major, single episode, in partial remission (HCC) Doing well on current medication - TSH - citalopram (CELEXA) 20 MG tablet; Take 1 tablet (20 mg total) by mouth daily.  Dispense: 90 tablet; Refill: 3  3. Obstructive apnea Continue CPAP nightly  4. Abnormal LFTs Continue to monitor - Comprehensive metabolic panel  Meds ordered this encounter  Medications  . citalopram (CELEXA) 20 MG tablet    Sig: Take 1 tablet (20 mg total) by mouth daily.    Dispense:  90 tablet    Refill:  3    Partially dictated using Animal nutritionist. Any errors are unintentional.  Bari Edward, MD [facility]  08/21/2017

## 2017-08-23 LAB — LIPID PANEL
CHOLESTEROL TOTAL: 199 mg/dL (ref 100–199)
Chol/HDL Ratio: 3.8 ratio (ref 0.0–5.0)
HDL: 53 mg/dL (ref >39)
LDL CALC: 122 mg/dL — AB (ref 0–99)
TRIGLYCERIDES: 122 mg/dL (ref 0–149)
VLDL Cholesterol Cal: 24 mg/dL (ref 5–40)

## 2017-08-23 LAB — COMPREHENSIVE METABOLIC PANEL
ALK PHOS: 62 IU/L (ref 39–117)
ALT: 44 IU/L (ref 0–44)
AST: 25 IU/L (ref 0–40)
Albumin/Globulin Ratio: 2.3 — ABNORMAL HIGH (ref 1.2–2.2)
Albumin: 4.9 g/dL (ref 3.5–5.5)
BUN/Creatinine Ratio: 12 (ref 9–20)
BUN: 14 mg/dL (ref 6–24)
Bilirubin Total: 0.5 mg/dL (ref 0.0–1.2)
CHLORIDE: 100 mmol/L (ref 96–106)
CO2: 26 mmol/L (ref 20–29)
Calcium: 9.4 mg/dL (ref 8.7–10.2)
Creatinine, Ser: 1.16 mg/dL (ref 0.76–1.27)
GFR calc Af Amer: 87 mL/min/{1.73_m2} (ref >59)
GFR, EST NON AFRICAN AMERICAN: 76 mL/min/{1.73_m2} (ref >59)
GLUCOSE: 96 mg/dL (ref 65–99)
Globulin, Total: 2.1 g/dL (ref 1.5–4.5)
POTASSIUM: 4.7 mmol/L (ref 3.5–5.2)
Sodium: 143 mmol/L (ref 134–144)
TOTAL PROTEIN: 7 g/dL (ref 6.0–8.5)

## 2017-08-23 LAB — CBC WITH DIFFERENTIAL/PLATELET
BASOS ABS: 0 10*3/uL (ref 0.0–0.2)
Basos: 1 %
EOS (ABSOLUTE): 0.4 10*3/uL (ref 0.0–0.4)
Eos: 5 %
Hematocrit: 46.3 % (ref 37.5–51.0)
Hemoglobin: 15.5 g/dL (ref 13.0–17.7)
IMMATURE GRANS (ABS): 0 10*3/uL (ref 0.0–0.1)
IMMATURE GRANULOCYTES: 0 %
LYMPHS: 24 %
Lymphocytes Absolute: 1.8 10*3/uL (ref 0.7–3.1)
MCH: 31.6 pg (ref 26.6–33.0)
MCHC: 33.5 g/dL (ref 31.5–35.7)
MCV: 95 fL (ref 79–97)
MONOS ABS: 0.5 10*3/uL (ref 0.1–0.9)
Monocytes: 6 %
NEUTROS PCT: 64 %
Neutrophils Absolute: 5 10*3/uL (ref 1.4–7.0)
PLATELETS: 168 10*3/uL (ref 150–379)
RBC: 4.9 x10E6/uL (ref 4.14–5.80)
RDW: 13.9 % (ref 12.3–15.4)
WBC: 7.6 10*3/uL (ref 3.4–10.8)

## 2017-08-23 LAB — TSH: TSH: 0.785 u[IU]/mL (ref 0.450–4.500)

## 2017-12-26 DIAGNOSIS — I10 Essential (primary) hypertension: Secondary | ICD-10-CM | POA: Diagnosis not present

## 2017-12-26 DIAGNOSIS — G4733 Obstructive sleep apnea (adult) (pediatric): Secondary | ICD-10-CM | POA: Diagnosis not present

## 2017-12-29 DIAGNOSIS — G4733 Obstructive sleep apnea (adult) (pediatric): Secondary | ICD-10-CM | POA: Diagnosis not present

## 2018-08-24 ENCOUNTER — Ambulatory Visit (INDEPENDENT_AMBULATORY_CARE_PROVIDER_SITE_OTHER): Payer: Federal, State, Local not specified - PPO | Admitting: Internal Medicine

## 2018-08-24 ENCOUNTER — Encounter: Payer: Self-pay | Admitting: Internal Medicine

## 2018-08-24 VITALS — BP 112/74 | HR 70 | Ht 67.0 in | Wt 212.0 lb

## 2018-08-24 DIAGNOSIS — R945 Abnormal results of liver function studies: Secondary | ICD-10-CM | POA: Diagnosis not present

## 2018-08-24 DIAGNOSIS — G4733 Obstructive sleep apnea (adult) (pediatric): Secondary | ICD-10-CM

## 2018-08-24 DIAGNOSIS — F325 Major depressive disorder, single episode, in full remission: Secondary | ICD-10-CM | POA: Diagnosis not present

## 2018-08-24 DIAGNOSIS — R7989 Other specified abnormal findings of blood chemistry: Secondary | ICD-10-CM

## 2018-08-24 DIAGNOSIS — E785 Hyperlipidemia, unspecified: Secondary | ICD-10-CM

## 2018-08-24 DIAGNOSIS — Z0000 Encounter for general adult medical examination without abnormal findings: Secondary | ICD-10-CM | POA: Diagnosis not present

## 2018-08-24 LAB — POCT URINALYSIS DIPSTICK
BILIRUBIN UA: NEGATIVE
Blood, UA: NEGATIVE
GLUCOSE UA: NEGATIVE
Ketones, UA: NEGATIVE
Leukocytes, UA: NEGATIVE
Nitrite, UA: NEGATIVE
Protein, UA: NEGATIVE
Spec Grav, UA: 1.02 (ref 1.010–1.025)
Urobilinogen, UA: 0.2 E.U./dL
pH, UA: 5 (ref 5.0–8.0)

## 2018-08-24 MED ORDER — CITALOPRAM HYDROBROMIDE 20 MG PO TABS: 20.0000 mg | ORAL_TABLET | Freq: Every day | ORAL | 3 refills | Status: DC

## 2018-08-24 NOTE — Progress Notes (Signed)
Date:  08/24/2018   Name:  Bryan Sherman   DOB:  11/29/1970   MRN:  [id number]   Chief Complaint: Annual Exam Bryan Sherman is a 47 y.o. male who presents today for his Complete Annual Exam. He feels well. He reports exercising some and has changed his diet. He reports he is sleeping well. He has been working on diet and has lost about 20 lbs.  Depression         This is a chronic problem.  The problem has been resolved since onset.  Associated symptoms include no fatigue, no appetite change, no myalgias and no headaches.  Past treatments include SSRIs - Selective serotonin reuptake inhibitors.  Previous treatment provided significant relief. OSA - on nightly CPAP with good compliance and restful sleep.  He downloads the info yearly and it is still working well.   Review of Systems  Constitutional: Negative for appetite change, chills, diaphoresis, fatigue and unexpected weight change.  HENT: Negative for hearing loss, tinnitus, trouble swallowing and voice change.   Eyes: Negative for visual disturbance.  Respiratory: Negative for choking, shortness of breath and wheezing.   Cardiovascular: Negative for chest pain, palpitations and leg swelling.  Gastrointestinal: Negative for abdominal pain, blood in stool, constipation and diarrhea.  Genitourinary: Negative for difficulty urinating, dysuria, frequency, hematuria and urgency.  Musculoskeletal: Negative for arthralgias, back pain and myalgias.  Skin: Negative for color change and rash.  Neurological: Negative for dizziness, syncope and headaches.  Hematological: Negative for adenopathy.  Psychiatric/Behavioral: Positive for depression. Negative for dysphoric mood and sleep disturbance.    Patient Active Problem List   Diagnosis Date Noted  . Major depressive disorder, single episode in full remission (HCC) 08/24/2018  . Hyperlipidemia, mild 08/24/2018  . Abnormal LFTs 04/11/2015  . Obstructive apnea 04/11/2015    No  Known Allergies  Past Surgical History:  Procedure Laterality Date  . SHOULDER ARTHROSCOPY Left 2006    Social History   Tobacco Use  . Smoking status: Former Games developer  . Smokeless tobacco: Current User    Types: Chew  Substance Use Topics  . Alcohol use: Yes    Alcohol/week: 4.0 standard drinks    Types: 4 Standard drinks or equivalent per week    Comment: Social  . Drug use: No     Medication list has been reviewed and updated.  Current Meds  Medication Sig  . citalopram (CELEXA) 20 MG tablet Take 1 tablet (20 mg total) by mouth daily.  Marland Kitchen loratadine (CLARITIN) 10 MG tablet Take 10 mg by mouth daily.  . NON FORMULARY CPAP @@17  cm H20    PHQ 2/9 Scores 08/24/2018 08/21/2017 08/20/2016  PHQ - 2 Score 0 0 0  PHQ- 9 Score 0 0 -    Physical Exam  Constitutional: He is oriented to person, place, and time. He appears well-developed and well-nourished.  HENT:  Head: Normocephalic.  Right Ear: Tympanic membrane, external ear and ear canal normal.  Left Ear: Tympanic membrane, external ear and ear canal normal.  Nose: Nose normal.  Mouth/Throat: Uvula is midline and oropharynx is clear and moist.  Eyes: Pupils are equal, round, and reactive to light. Conjunctivae and EOM are normal.  Neck: Normal range of motion. Neck supple. Carotid bruit is not present. No thyromegaly present.  Cardiovascular: Normal rate, regular rhythm, normal heart sounds and intact distal pulses.  Pulmonary/Chest: Effort normal and breath sounds normal. He has no wheezes. Right breast exhibits no mass. Left breast exhibits  no mass.    Abdominal: Soft. Normal appearance and bowel sounds are normal. There is no hepatosplenomegaly. There is no tenderness.  Musculoskeletal: Normal range of motion. He exhibits no edema.  Lymphadenopathy:    He has no cervical adenopathy.  Neurological: He is alert and oriented to person, place, and time. He has normal reflexes.  Skin: Skin is warm, dry and intact.    Psychiatric: He has a normal mood and affect. His speech is normal and behavior is normal. Judgment and thought content normal.  Nursing note and vitals reviewed.   BP 112/74 (BP Location: Right Arm, Patient Position: Sitting, Cuff Size: Large)   Pulse 70   Ht 5\' 7"  (1.702 m)   Wt 212 lb (96.2 kg)   SpO2 98%   BMI 33.20 kg/m   Assessment and Plan: 1. Annual physical exam Doing well with diet and weight loss - POCT urinalysis dipstick  2. Obstructive apnea Excellent CPAP compliance - CBC with Differential/Platelet  3. Major depression, single episode, in complete remission (HCC) Doing well with medication - citalopram (CELEXA) 20 MG tablet; Take 1 tablet (20 mg total) by mouth daily.  Dispense: 90 tablet; Refill: 3  4. Abnormal LFTs - Comprehensive metabolic panel  5. Hyperlipidemia, mild Should improve with diet changes - Lipid panel   Partially dictated using Animal nutritionist. Any errors are unintentional.  Bari Edward, MD [facility]  08/24/2018

## 2018-08-24 NOTE — Patient Instructions (Signed)

## 2018-08-25 LAB — LIPID PANEL
CHOL/HDL RATIO: 3.5 ratio (ref 0.0–5.0)
Cholesterol, Total: 207 mg/dL — ABNORMAL HIGH (ref 100–199)
HDL: 60 mg/dL (ref >39)
LDL CALC: 118 mg/dL — AB (ref 0–99)
Triglycerides: 147 mg/dL (ref 0–149)
VLDL CHOLESTEROL CAL: 29 mg/dL (ref 5–40)

## 2018-08-25 LAB — COMPREHENSIVE METABOLIC PANEL
ALT: 34 IU/L (ref 0–44)
AST: 28 IU/L (ref 0–40)
Albumin/Globulin Ratio: 2.1 (ref 1.2–2.2)
Albumin: 4.8 g/dL (ref 3.5–5.5)
Alkaline Phosphatase: 68 IU/L (ref 39–117)
BUN/Creatinine Ratio: 19 (ref 9–20)
BUN: 19 mg/dL (ref 6–24)
Bilirubin Total: 0.4 mg/dL (ref 0.0–1.2)
CALCIUM: 9.3 mg/dL (ref 8.7–10.2)
CO2: 27 mmol/L (ref 20–29)
Chloride: 96 mmol/L (ref 96–106)
Creatinine, Ser: 1.02 mg/dL (ref 0.76–1.27)
GFR calc Af Amer: 101 mL/min/{1.73_m2} (ref >59)
GFR, EST NON AFRICAN AMERICAN: 88 mL/min/{1.73_m2} (ref >59)
Globulin, Total: 2.3 g/dL (ref 1.5–4.5)
Glucose: 83 mg/dL (ref 65–99)
Potassium: 4.2 mmol/L (ref 3.5–5.2)
Sodium: 143 mmol/L (ref 134–144)
TOTAL PROTEIN: 7.1 g/dL (ref 6.0–8.5)

## 2018-08-25 LAB — CBC WITH DIFFERENTIAL/PLATELET
Basophils Absolute: 0 10*3/uL (ref 0.0–0.2)
Basos: 0 %
EOS (ABSOLUTE): 0.2 10*3/uL (ref 0.0–0.4)
Eos: 3 %
Hematocrit: 45.4 % (ref 37.5–51.0)
Hemoglobin: 15.2 g/dL (ref 13.0–17.7)
Immature Grans (Abs): 0 10*3/uL (ref 0.0–0.1)
Immature Granulocytes: 0 %
Lymphocytes Absolute: 1.8 10*3/uL (ref 0.7–3.1)
Lymphs: 24 %
MCH: 31.6 pg (ref 26.6–33.0)
MCHC: 33.5 g/dL (ref 31.5–35.7)
MCV: 94 fL (ref 79–97)
Monocytes Absolute: 0.5 10*3/uL (ref 0.1–0.9)
Monocytes: 7 %
Neutrophils Absolute: 4.8 10*3/uL (ref 1.4–7.0)
Neutrophils: 66 %
Platelets: 169 10*3/uL (ref 150–450)
RBC: 4.81 x10E6/uL (ref 4.14–5.80)
RDW: 14 % (ref 12.3–15.4)
WBC: 7.2 10*3/uL (ref 3.4–10.8)

## 2019-01-15 DIAGNOSIS — F341 Dysthymic disorder: Secondary | ICD-10-CM | POA: Diagnosis not present

## 2019-01-15 DIAGNOSIS — G4733 Obstructive sleep apnea (adult) (pediatric): Secondary | ICD-10-CM | POA: Diagnosis not present

## 2019-01-15 DIAGNOSIS — E669 Obesity, unspecified: Secondary | ICD-10-CM | POA: Diagnosis not present

## 2019-01-18 DIAGNOSIS — G4733 Obstructive sleep apnea (adult) (pediatric): Secondary | ICD-10-CM | POA: Diagnosis not present

## 2019-04-28 DIAGNOSIS — E669 Obesity, unspecified: Secondary | ICD-10-CM | POA: Diagnosis not present

## 2019-04-28 DIAGNOSIS — G4733 Obstructive sleep apnea (adult) (pediatric): Secondary | ICD-10-CM | POA: Diagnosis not present

## 2019-04-28 DIAGNOSIS — F329 Major depressive disorder, single episode, unspecified: Secondary | ICD-10-CM | POA: Diagnosis not present

## 2019-08-18 DIAGNOSIS — G4733 Obstructive sleep apnea (adult) (pediatric): Secondary | ICD-10-CM | POA: Diagnosis not present

## 2019-08-29 ENCOUNTER — Ambulatory Visit (INDEPENDENT_AMBULATORY_CARE_PROVIDER_SITE_OTHER): Payer: Federal, State, Local not specified - PPO | Admitting: Internal Medicine

## 2019-08-29 ENCOUNTER — Other Ambulatory Visit: Payer: Self-pay

## 2019-08-29 ENCOUNTER — Encounter: Payer: Self-pay | Admitting: Internal Medicine

## 2019-08-29 VITALS — BP 112/72 | HR 70 | Ht 67.0 in | Wt 237.0 lb

## 2019-08-29 DIAGNOSIS — F325 Major depressive disorder, single episode, in full remission: Secondary | ICD-10-CM | POA: Diagnosis not present

## 2019-08-29 DIAGNOSIS — G4733 Obstructive sleep apnea (adult) (pediatric): Secondary | ICD-10-CM | POA: Diagnosis not present

## 2019-08-29 DIAGNOSIS — E785 Hyperlipidemia, unspecified: Secondary | ICD-10-CM | POA: Diagnosis not present

## 2019-08-29 DIAGNOSIS — Z0000 Encounter for general adult medical examination without abnormal findings: Secondary | ICD-10-CM | POA: Diagnosis not present

## 2019-08-29 LAB — POCT URINALYSIS DIPSTICK
Bilirubin, UA: NEGATIVE
Blood, UA: NEGATIVE
Glucose, UA: NEGATIVE
Ketones, UA: NEGATIVE
Leukocytes, UA: NEGATIVE
Nitrite, UA: NEGATIVE
Protein, UA: NEGATIVE
Spec Grav, UA: 1.02 (ref 1.010–1.025)
Urobilinogen, UA: 0.2 E.U./dL
pH, UA: 6 (ref 5.0–8.0)

## 2019-08-29 MED ORDER — CITALOPRAM HYDROBROMIDE 20 MG PO TABS: 20.0000 mg | ORAL_TABLET | Freq: Every day | ORAL | 3 refills | Status: AC

## 2019-08-29 NOTE — Patient Instructions (Signed)

## 2019-08-29 NOTE — Progress Notes (Signed)
Date:  08/29/2019   Name:  Bryan Sherman   DOB:  1971/07/26   MRN:  [id number]   Chief Complaint: Annual Exam Saleem Coccia Mcmeen is a 48 y.o. male who presents today for his Complete Annual Exam. He feels well. He reports exercising rarely but he is working on his diet for weight loss. He reports he is sleeping well.   Depression        This is a recurrent problem.The problem is unchanged.  Associated symptoms include no fatigue, no appetite change, no myalgias and no headaches.  Past treatments include SSRIs - Selective serotonin reuptake inhibitors.  Compliance with treatment is good. OSA - on CPAP nightly, wakes refreshed, no morning headaches.  Lab Results  Component Value Date   CREATININE 1.02 08/24/2018   BUN 19 08/24/2018   NA 143 08/24/2018   K 4.2 08/24/2018   CL 96 08/24/2018   CO2 27 08/24/2018   Lab Results  Component Value Date   CHOL 207 (H) 08/24/2018   HDL 60 08/24/2018   LDLCALC 118 (H) 08/24/2018   TRIG 147 08/24/2018   CHOLHDL 3.5 08/24/2018   Lab Results  Component Value Date   WBC 7.2 08/24/2018   HGB 15.2 08/24/2018   HCT 45.4 08/24/2018   MCV 94 08/24/2018   PLT 169 08/24/2018     Review of Systems  Constitutional: Negative for appetite change, chills, diaphoresis, fatigue and unexpected weight change.  HENT: Negative for hearing loss, tinnitus, trouble swallowing and voice change.   Eyes: Negative for visual disturbance.  Respiratory: Negative for choking, shortness of breath and wheezing.   Cardiovascular: Negative for chest pain, palpitations and leg swelling.  Gastrointestinal: Negative for abdominal pain, blood in stool, constipation and diarrhea.  Genitourinary: Negative for difficulty urinating, dysuria, frequency and urgency.  Musculoskeletal: Negative for arthralgias, back pain and myalgias.  Skin: Negative for color change and rash.  Allergic/Immunologic: Negative for environmental allergies and food allergies.  Neurological:  Negative for dizziness, syncope and headaches.  Hematological: Negative for adenopathy.  Psychiatric/Behavioral: Positive for depression. Negative for dysphoric mood and sleep disturbance.    Patient Active Problem List   Diagnosis Date Noted  . Major depressive disorder, single episode in full remission (HCC) 08/24/2018  . Hyperlipidemia, mild 08/24/2018  . Abnormal LFTs 04/11/2015  . Obstructive apnea 04/11/2015    No Known Allergies  Past Surgical History:  Procedure Laterality Date  . SHOULDER ARTHROSCOPY Left 2006    Social History   Tobacco Use  . Smoking status: Former Games developer  . Smokeless tobacco: Current User    Types: Chew  Substance Use Topics  . Alcohol use: Yes    Alcohol/week: 4.0 standard drinks    Types: 4 Standard drinks or equivalent per week    Comment: Social  . Drug use: No     Medication list has been reviewed and updated.  Current Meds  Medication Sig  . citalopram (CELEXA) 20 MG tablet Take 1 tablet (20 mg total) by mouth daily.  Marland Kitchen loratadine (CLARITIN) 10 MG tablet Take 10 mg by mouth daily.  . NON FORMULARY CPAP @@17  cm H20    PHQ 2/9 Scores 08/29/2019 08/24/2018 08/21/2017 08/20/2016  PHQ - 2 Score 0 0 0 0  PHQ- 9 Score - 0 0 -    BP Readings from Last 3 Encounters:  08/29/19 112/72  08/24/18 112/74  08/21/17 116/68    Physical Exam Vitals signs and nursing note reviewed.  Constitutional:  General: He is not in acute distress.    Appearance: Normal appearance. He is well-developed.  HENT:     Head: Normocephalic and atraumatic.     Right Ear: Tympanic membrane, ear canal and external ear normal.     Left Ear: Tympanic membrane, ear canal and external ear normal.     Nose: Nose normal.     Mouth/Throat:     Pharynx: Uvula midline.  Eyes:     Conjunctiva/sclera: Conjunctivae normal.     Pupils: Pupils are equal, round, and reactive to light.  Neck:     Musculoskeletal: Normal range of motion and neck supple.      Thyroid: No thyromegaly.     Vascular: No carotid bruit.  Cardiovascular:     Rate and Rhythm: Normal rate and regular rhythm.     Pulses: Normal pulses.     Heart sounds: Normal heart sounds. No murmur.  Pulmonary:     Effort: Pulmonary effort is normal. No respiratory distress.     Breath sounds: Normal breath sounds. No wheezing.  Chest:     Breasts:        Right: No mass.        Left: No mass.  Abdominal:     General: Bowel sounds are normal.     Palpations: Abdomen is soft.     Tenderness: There is no abdominal tenderness.  Musculoskeletal: Normal range of motion.     Right lower leg: No edema.     Left lower leg: No edema.  Lymphadenopathy:     Cervical: No cervical adenopathy.  Skin:    General: Skin is warm and dry.     Capillary Refill: Capillary refill takes less than 2 seconds.     Findings: No rash.  Neurological:     General: No focal deficit present.     Mental Status: He is alert and oriented to person, place, and time.     Deep Tendon Reflexes: Reflexes are normal and symmetric.  Psychiatric:        Speech: Speech normal.        Behavior: Behavior normal.        Thought Content: Thought content normal.        Judgment: Judgment normal.     Wt Readings from Last 3 Encounters:  08/29/19 237 lb (107.5 kg)  08/24/18 212 lb (96.2 kg)  08/21/17 234 lb (106.1 kg)    BP 112/72   Pulse 70   Ht 5\' 7"  (1.702 m)   Wt 237 lb (107.5 kg)   SpO2 96%   BMI 37.12 kg/m   Assessment and Plan: 1. Annual physical exam Normal exam except for weight Continue to work on diet - CBC with Differential/Platelet - Comprehensive metabolic panel - POCT urinalysis dipstick  2. Major depressive disorder, single episode in full remission ([place]) Clinically doing very well on current therapy Will continue same dose - citalopram (CELEXA) 20 MG tablet; Take 1 tablet (20 mg total) by mouth daily.  Dispense: 90 tablet; Refill: 3  3. Hyperlipidemia, mild Check labs and advise  - Lipid panel  4. Obstructive apnea Excellent CPAP compliance and response Continue nightly   Partially dictated using Editor, commissioning. Any errors are unintentional.  Halina Maidens, MD [facility]  08/29/2019

## 2019-08-30 LAB — COMPREHENSIVE METABOLIC PANEL
ALT: 64 IU/L — ABNORMAL HIGH (ref 0–44)
AST: 38 IU/L (ref 0–40)
Albumin/Globulin Ratio: 2.2 (ref 1.2–2.2)
Albumin: 4.4 g/dL (ref 4.0–5.0)
Alkaline Phosphatase: 65 IU/L (ref 39–117)
BUN/Creatinine Ratio: 12 (ref 9–20)
BUN: 13 mg/dL (ref 6–24)
Bilirubin Total: 0.3 mg/dL (ref 0.0–1.2)
CO2: 26 mmol/L (ref 20–29)
Calcium: 9 mg/dL (ref 8.7–10.2)
Chloride: 101 mmol/L (ref 96–106)
Creatinine, Ser: 1.06 mg/dL (ref 0.76–1.27)
GFR calc Af Amer: 96 mL/min/{1.73_m2} (ref >59)
GFR calc non Af Amer: 83 mL/min/{1.73_m2} (ref >59)
Globulin, Total: 2 g/dL (ref 1.5–4.5)
Glucose: 99 mg/dL (ref 65–99)
Potassium: 4.5 mmol/L (ref 3.5–5.2)
Sodium: 144 mmol/L (ref 134–144)
Total Protein: 6.4 g/dL (ref 6.0–8.5)

## 2019-08-30 LAB — CBC WITH DIFFERENTIAL/PLATELET
Basophils Absolute: 0.1 10*3/uL (ref 0.0–0.2)
Basos: 1 %
EOS (ABSOLUTE): 0.3 10*3/uL (ref 0.0–0.4)
Eos: 4 %
Hematocrit: 42.9 % (ref 37.5–51.0)
Hemoglobin: 15 g/dL (ref 13.0–17.7)
Immature Grans (Abs): 0 10*3/uL (ref 0.0–0.1)
Immature Granulocytes: 1 %
Lymphocytes Absolute: 1.9 10*3/uL (ref 0.7–3.1)
Lymphs: 29 %
MCH: 32.8 pg (ref 26.6–33.0)
MCHC: 35 g/dL (ref 31.5–35.7)
MCV: 94 fL (ref 79–97)
Monocytes Absolute: 0.5 10*3/uL (ref 0.1–0.9)
Monocytes: 7 %
Neutrophils Absolute: 3.8 10*3/uL (ref 1.4–7.0)
Neutrophils: 58 %
Platelets: 156 10*3/uL (ref 150–450)
RBC: 4.57 x10E6/uL (ref 4.14–5.80)
RDW: 12.6 % (ref 11.6–15.4)
WBC: 6.6 10*3/uL (ref 3.4–10.8)

## 2019-08-30 LAB — LIPID PANEL
Chol/HDL Ratio: 4 ratio (ref 0.0–5.0)
Cholesterol, Total: 204 mg/dL — ABNORMAL HIGH (ref 100–199)
HDL: 51 mg/dL (ref >39)
LDL Chol Calc (NIH): 124 mg/dL — ABNORMAL HIGH (ref 0–99)
Triglycerides: 162 mg/dL — ABNORMAL HIGH (ref 0–149)
VLDL Cholesterol Cal: 29 mg/dL (ref 5–40)

## 2019-09-08 DIAGNOSIS — D485 Neoplasm of uncertain behavior of skin: Secondary | ICD-10-CM | POA: Diagnosis not present

## 2019-09-19 DIAGNOSIS — G4733 Obstructive sleep apnea (adult) (pediatric): Secondary | ICD-10-CM | POA: Diagnosis not present

## 2019-10-19 DIAGNOSIS — G4733 Obstructive sleep apnea (adult) (pediatric): Secondary | ICD-10-CM | POA: Diagnosis not present

## 2019-12-22 DIAGNOSIS — G4733 Obstructive sleep apnea (adult) (pediatric): Secondary | ICD-10-CM | POA: Diagnosis not present

## 2020-01-10 ENCOUNTER — Other Ambulatory Visit: Payer: Self-pay

## 2020-01-10 ENCOUNTER — Encounter: Payer: Self-pay | Admitting: Internal Medicine

## 2020-01-10 ENCOUNTER — Ambulatory Visit: Payer: Federal, State, Local not specified - PPO | Admitting: Internal Medicine

## 2020-01-10 VITALS — BP 108/68 | HR 87 | Temp 97.1°F | Ht 67.0 in | Wt 245.0 lb

## 2020-01-10 DIAGNOSIS — F325 Major depressive disorder, single episode, in full remission: Secondary | ICD-10-CM

## 2020-01-10 DIAGNOSIS — F5101 Primary insomnia: Secondary | ICD-10-CM | POA: Diagnosis not present

## 2020-01-10 NOTE — Progress Notes (Signed)
Date:  01/10/2020   Name:  Bryan Sherman   DOB:  1971-06-25   MRN:  [id number]   Chief Complaint: Insomnia (X 3 weeks been having issues sleeping at night. Sleeping at most 3 hours at night. Cannot fall asleep. Stays up til 2-3 in the morning and has to wake up at 5am for work.) and Depression (Follow up. Takes Celexa. )  Insomnia Primary symptoms: sleep disturbance.  The current episode started 1 to 4 weeks ago. The onset quality is sudden. The problem occurs nightly. The problem is unchanged. How many beverages per day that contain caffeine: 0 - 1.  Past treatments include alcohol and medication. The treatment provided no relief. How long after going to bed to you fall asleep: over an hour.   PMH includes: depression, no family stress or anxiety, no restless leg syndrome, no work related stressors, no chronic pain. Prior diagnostic workup includes:  No prior workup.  Depression        This is a chronic problem.  The problem has been resolved since onset.  Associated symptoms include fatigue and insomnia.  Associated symptoms include no headaches.  Past treatments include SSRIs - Selective serotonin reuptake inhibitors.   Lab Results  Component Value Date   CREATININE 1.06 08/29/2019   BUN 13 08/29/2019   NA 144 08/29/2019   K 4.5 08/29/2019   CL 101 08/29/2019   CO2 26 08/29/2019   Lab Results  Component Value Date   CHOL 204 (H) 08/29/2019   HDL 51 08/29/2019   LDLCALC 124 (H) 08/29/2019   TRIG 162 (H) 08/29/2019   CHOLHDL 4.0 08/29/2019   Lab Results  Component Value Date   TSH 0.785 08/21/2017   No results found for: HGBA1C   Review of Systems  Constitutional: Positive for fatigue. Negative for chills and fever.  Respiratory: Negative for chest tightness and shortness of breath.   Cardiovascular: Negative for chest pain and palpitations.  Musculoskeletal: Negative for arthralgias.  Neurological: Negative for dizziness and headaches.  Psychiatric/Behavioral:  Positive for depression and sleep disturbance. Negative for dysphoric mood. The patient has insomnia. The patient is not nervous/anxious.     Patient Active Problem List   Diagnosis Date Noted  . Major depressive disorder, single episode in full remission ([place]) 08/24/2018  . Hyperlipidemia, mild 08/24/2018  . Abnormal LFTs 04/11/2015  . Obstructive apnea 04/11/2015    No Known Allergies  Past Surgical History:  Procedure Laterality Date  . SHOULDER ARTHROSCOPY Left 2006    Social History   Tobacco Use  . Smoking status: Former Research scientist (life sciences)  . Smokeless tobacco: Current User    Types: Chew  Substance Use Topics  . Alcohol use: Yes    Alcohol/week: 4.0 standard drinks    Types: 4 Standard drinks or equivalent per week    Comment: Social  . Drug use: No     Medication list has been reviewed and updated.  Current Meds  Medication Sig  . citalopram (CELEXA) 20 MG tablet Take 1 tablet (20 mg total) by mouth daily.  Marland Kitchen loratadine (CLARITIN) 10 MG tablet Take 10 mg by mouth daily.  . NON FORMULARY CPAP @@17  cm H20    PHQ 2/9 Scores 01/10/2020 08/29/2019 08/24/2018 08/21/2017  PHQ - 2 Score 1 0 0 0  PHQ- 9 Score 7 - 0 0    BP Readings from Last 3 Encounters:  01/10/20 108/68  08/29/19 112/72  08/24/18 112/74    Physical Exam Vitals and nursing  note reviewed.  Constitutional:      General: He is not in acute distress.    Appearance: He is well-developed.  HENT:     Head: Normocephalic and atraumatic.  Cardiovascular:     Rate and Rhythm: Normal rate and regular rhythm.     Pulses: Normal pulses.  Pulmonary:     Effort: Pulmonary effort is normal. No respiratory distress.     Breath sounds: No wheezing or rhonchi.  Musculoskeletal:     Cervical back: Normal range of motion.  Lymphadenopathy:     Cervical: No cervical adenopathy.  Skin:    General: Skin is warm and dry.     Findings: No rash.  Neurological:     Mental Status: He is alert and oriented to person,  place, and time.  Psychiatric:        Attention and Perception: Attention normal.        Mood and Affect: Mood normal.        Behavior: Behavior normal.        Thought Content: Thought content normal.     Wt Readings from Last 3 Encounters:  01/10/20 245 lb (111.1 kg)  08/29/19 237 lb (107.5 kg)  08/24/18 212 lb (96.2 kg)    BP 108/68   Pulse 87   Temp (!) 97.1 F (36.2 C) (Temporal)   Ht 5\' 7"  (1.702 m)   Wt 245 lb (111.1 kg)   SpO2 96%   BMI 38.37 kg/m   Assessment and Plan: 1. Primary insomnia Unclear reasons for onset Will give Belsomra sample 20 mg - call for Rx if helpful If not beneficial, will try Trazodone  2. Major depressive disorder, single episode in full remission (HCC) Clinically stable on current regimen with good control of symptoms, No SI or HI. Will continue current therapy with Celexa 20 mg daily.    Partially dictated using . Any errors are unintentional.  Animal nutritionist, MD [facility]  01/10/2020

## 2020-05-03 DIAGNOSIS — E669 Obesity, unspecified: Secondary | ICD-10-CM | POA: Diagnosis not present

## 2020-05-03 DIAGNOSIS — G4733 Obstructive sleep apnea (adult) (pediatric): Secondary | ICD-10-CM | POA: Diagnosis not present

## 2020-08-20 DIAGNOSIS — L0591 Pilonidal cyst without abscess: Secondary | ICD-10-CM | POA: Diagnosis not present

## 2020-08-24 DIAGNOSIS — F419 Anxiety disorder, unspecified: Secondary | ICD-10-CM | POA: Diagnosis not present

## 2020-08-24 DIAGNOSIS — F32A Depression, unspecified: Secondary | ICD-10-CM | POA: Diagnosis not present

## 2020-08-24 DIAGNOSIS — G4733 Obstructive sleep apnea (adult) (pediatric): Secondary | ICD-10-CM | POA: Diagnosis not present

## 2020-08-24 DIAGNOSIS — L0591 Pilonidal cyst without abscess: Secondary | ICD-10-CM | POA: Diagnosis not present

## 2020-08-24 DIAGNOSIS — Z720 Tobacco use: Secondary | ICD-10-CM | POA: Diagnosis not present

## 2020-08-29 ENCOUNTER — Encounter: Payer: Federal, State, Local not specified - PPO | Admitting: Internal Medicine

## 2020-10-31 DIAGNOSIS — G4733 Obstructive sleep apnea (adult) (pediatric): Secondary | ICD-10-CM | POA: Diagnosis not present

## 2020-12-01 DIAGNOSIS — G4733 Obstructive sleep apnea (adult) (pediatric): Secondary | ICD-10-CM | POA: Diagnosis not present

## 2021-01-01 DIAGNOSIS — G4733 Obstructive sleep apnea (adult) (pediatric): Secondary | ICD-10-CM | POA: Diagnosis not present

## 2021-01-29 DIAGNOSIS — G4733 Obstructive sleep apnea (adult) (pediatric): Secondary | ICD-10-CM | POA: Diagnosis not present

## 2021-03-01 DIAGNOSIS — G4733 Obstructive sleep apnea (adult) (pediatric): Secondary | ICD-10-CM | POA: Diagnosis not present

## 2021-03-31 DIAGNOSIS — G4733 Obstructive sleep apnea (adult) (pediatric): Secondary | ICD-10-CM | POA: Diagnosis not present

## 2021-04-25 NOTE — Progress Notes (Unsigned)
error 

## 2021-04-30 ENCOUNTER — Ambulatory Visit: Payer: Federal, State, Local not specified - PPO

## 2021-05-01 DIAGNOSIS — G4733 Obstructive sleep apnea (adult) (pediatric): Secondary | ICD-10-CM | POA: Diagnosis not present

## 2021-05-20 NOTE — Progress Notes (Unsigned)
No show for appointment. Office will call to reschedule.  

## 2021-05-22 ENCOUNTER — Ambulatory Visit: Payer: Federal, State, Local not specified - PPO

## 2021-09-11 ENCOUNTER — Ambulatory Visit (INDEPENDENT_AMBULATORY_CARE_PROVIDER_SITE_OTHER): Payer: Federal, State, Local not specified - PPO | Admitting: Internal Medicine

## 2021-09-11 ENCOUNTER — Other Ambulatory Visit: Payer: Self-pay

## 2021-09-11 VITALS — BP 125/84 | HR 104 | Resp 18 | Ht 66.0 in | Wt 227.0 lb

## 2021-09-11 DIAGNOSIS — E669 Obesity, unspecified: Secondary | ICD-10-CM

## 2021-09-11 DIAGNOSIS — Z7189 Other specified counseling: Secondary | ICD-10-CM | POA: Diagnosis not present

## 2021-09-11 DIAGNOSIS — Z6839 Body mass index (BMI) 39.0-39.9, adult: Secondary | ICD-10-CM

## 2021-09-11 DIAGNOSIS — G4733 Obstructive sleep apnea (adult) (pediatric): Secondary | ICD-10-CM | POA: Diagnosis not present

## 2021-09-11 DIAGNOSIS — F325 Major depressive disorder, single episode, in full remission: Secondary | ICD-10-CM

## 2021-09-11 NOTE — Patient Instructions (Signed)

## 2021-09-11 NOTE — Progress Notes (Signed)
[facility] [address] [place], [place] [address]  Pulmonary Sleep Medicine   Office Visit Note  Patient Name: Bryan Sherman DOB: 05/10/1971 MRN [id number]    Chief Complaint: Obstructive Sleep Apnea visit  Brief History:  [name] is seen today for initial consult on CPAP @ 13cmH2O. The patient has a 10 year history of sleep apnea. Patient is using PAP nightly.  The patient feels rested after sleeping with PAP.  The patient reports benefiting from PAP use. Reported sleepiness is improved and the Epworth Sleepiness Score is 7 out of 24. The patient sometimes takes naps. The patient complains of the following: none at this time.  The compliance download shows 97% compliance with an average use time of 9 hours 12 minutes. The AHI is 2.4.  The patient does not complain of limb movements disrupting sleep.  ROS  General: (-) fever, (-) chills, (-) night sweat Nose and Sinuses: (-) nasal stuffiness or itchiness, (-) postnasal drip, (-) nosebleeds, (-) sinus trouble. Mouth and Throat: (-) sore throat, (-) hoarseness. Neck: (-) swollen glands, (-) enlarged thyroid, (-) neck pain. Respiratory: - cough, - shortness of breath, - wheezing. Neurologic: - numbness, - tingling. Psychiatric: - anxiety, - depression   Current Medication: Outpatient Encounter Medications as of 09/11/2021  Medication Sig   citalopram (CELEXA) 20 MG tablet Take 1 tablet (20 mg total) by mouth daily.   loratadine (CLARITIN) 10 MG tablet Take 10 mg by mouth daily.   NON FORMULARY CPAP @@17  cm H20   No facility-administered encounter medications on file as of 09/11/2021.    Surgical History: Past Surgical History:  Procedure Laterality Date   SHOULDER ARTHROSCOPY Left 2006    Medical History: Past Medical History:  Diagnosis Date   Ankle fracture 2017   left   Depression, major, single episode, in partial remission (HCC) 04/11/2015   Excessive body weight gain 04/11/2015    Family  History: Non contributory to the present illness  Social History: Social History   Socioeconomic History   Marital status: Married    Spouse name: Not on file   Number of children: 1   Years of education: Not on file   Highest education level: Not on file  Occupational History   Not on file  Tobacco Use   Smoking status: Former   Smokeless tobacco: Current    Types: Chew  Substance and Sexual Activity   Alcohol use: Yes    Alcohol/week: 4.0 standard drinks    Types: 4 Standard drinks or equivalent per week    Comment: Social   Drug use: No   Sexual activity: Not on file  Other Topics Concern   Not on file  Social History Narrative   Not on file   Social Determinants of Health   Financial Resource Strain: Not on file  Food Insecurity: Not on file  Transportation Needs: Not on file  Physical Activity: Not on file  Stress: Not on file  Social Connections: Not on file  Intimate Partner Violence: Not on file    Vital Signs: Blood pressure 125/84, pulse (!) 104, resp. rate 18, height 5\' 6"  (1.676 m), weight 227 lb (103 kg), SpO2 96 %.  Examination: General Appearance: The patient is well-developed, well-nourished, and in no distress. Neck Circumference: 48 cm Skin: Gross inspection of skin unremarkable. Head: normocephalic, no gross deformities. Eyes: no gross deformities noted. ENT: ears appear grossly normal Neurologic: Alert and oriented. No involuntary movements.    EPWORTH SLEEPINESS SCALE:  Scale:  (0)= no chance of dozing; (1)= slight chance of dozing; (2)= moderate chance of dozing; (3)= high chance of dozing  Chance  Situtation    Sitting and reading: 1    Watching TV: 1    Sitting Inactive in public: 0    As a passenger in car: 3      Lying down to rest: 2    Sitting and talking: 0    Sitting quielty after lunch: 0    In a car, stopped in traffic: 0   TOTAL SCORE:   7 out of 24    SLEEP STUDIES:  PSG (11/2010) AHI 22/hr, min  SpO2 88% Titration (12/2010) CPAP @ 13cmH2O   CPAP COMPLIANCE DATA:  Date Range: 09/08/2020-09/08/2021   Average Daily Use: 9 hours 12 minutes  Median Use: 8 hours 56 minutes  Compliance for > 4 Hours: 97%  AHI: 2.4 respiratory events per hour  Days Used: 364/365 days  Mask Leak: 17.2  95th Percentile Pressure: 13         LABS: No results found for this or any previous visit (from the past 2160 hour(s)).  Radiology: US Abdomen Complete  Result Date: 12/17/2011 * PRIOR REPORT IMPORTED FROM AN EXTERNAL SYSTEM * PRIOR REPORT IMPORTED FROM THE SYNGO WORKFLOW SYSTEM REASON FOR EXAM:    complete  abn LFTs COMMENTS: PROCEDURE:     MUS - MUS ABDOMEN GENERAL SURVEY  - Dec 17 2011 10:42AM RESULT:     The liver is hyperechogenic consistent with fatty infiltration. No focal hepatic mass is seen. The body of the pancreas is visualized and is normal in appearance. The head and tail are obscured by bowel gas. Spleen size is normal. The abdominal aorta is not visualized adequately for evaluation. The proximal portion of the inferior vena cava is visualized and is normal in appearance. No gallstones are seen. There is no thickening of the gallbladder wall. Common bile duct measures 3.2 mm in diameter which is within normal limits. The kidneys show no hydronephrosis. No ascites is seen. IMPRESSION: 1. The liver appears hyperechogenic, suspicious for fatty infiltration. 2. No gallstones are seen. 3. The pancreas is partially obscured. 4. The abdominal aorta is not visualized on this exam and is apparently obscured by bowel gas. Thank you for the opportunity to contribute to the care of your patient.     No results found.  No results found.    Assessment and Plan: Patient Active Problem List   Diagnosis Date Noted   Major depressive disorder, single episode in full remission (HCC) 08/24/2018   Hyperlipidemia, mild 08/24/2018   Abnormal LFTs 04/11/2015   Obstructive apnea 04/11/2015       The patient does tolerate PAP and reports benefit from PAP use. The patient was reminded how to adjust mask fit and advised to change supplies regularly. The patient was also counselled on nightly use. The compliance is excellent. The AHI is 2.4.  1. Obstructive apnea Continue excellent compliance  2. CPAP use counseling CPAP couseling-Discussed importance of adequate CPAP use as well as proper care and cleaning techniques of machine and all supplies.  3. Major depressive disorder, single episode in full remission (HCC) Continue current medication and f/u with PCP.  4. Obesity (BMI 30-39.9) Obesity Counseling: Had a lengthy discussion regarding patients BMI and weight issues. Patient was instructed on portion control as well as increased activity. Also discussed caloric restrictions with trying to maintain intake less than 2000 Kcal. Discussions were made in  accordance with the 5As of weight management. Simple actions such as not eating late and if able to, taking a walk is suggested.    General Counseling: I have discussed the findings of the evaluation and examination with Normand Sloop.  I have also discussed any further diagnostic evaluation thatmay be needed or ordered today. Jaydence verbalizes understanding of the findings of todays visit. We also reviewed his medications today and discussed drug interactions and side effects including but not limited excessive drowsiness and altered mental states. We also discussed that there is always a risk not just to him but also people around him. he has been encouraged to call the office with any questions or concerns that should arise related to todays visit.  No orders of the defined types were placed in this encounter.       I have personally obtained a history, examined the patient, evaluated laboratory and imaging results, formulated the assessment and plan and placed orders.  This patient was seen by Lynn Ito, PA-C in  collaboration with Dr. Freda Munro as a part of collaborative care agreement.  Yevonne Pax, MD [facility] Diplomate ABMS Pulmonary Critical Care Medicine and Sleep Medicine

## 2021-12-25 DIAGNOSIS — G4733 Obstructive sleep apnea (adult) (pediatric): Secondary | ICD-10-CM | POA: Diagnosis not present

## 2022-01-22 DIAGNOSIS — G4733 Obstructive sleep apnea (adult) (pediatric): Secondary | ICD-10-CM | POA: Diagnosis not present

## 2022-02-22 DIAGNOSIS — G4733 Obstructive sleep apnea (adult) (pediatric): Secondary | ICD-10-CM | POA: Diagnosis not present

## 2022-03-24 DIAGNOSIS — G4733 Obstructive sleep apnea (adult) (pediatric): Secondary | ICD-10-CM | POA: Diagnosis not present

## 2022-04-01 DIAGNOSIS — F325 Major depressive disorder, single episode, in full remission: Secondary | ICD-10-CM | POA: Diagnosis not present

## 2022-04-01 DIAGNOSIS — G4733 Obstructive sleep apnea (adult) (pediatric): Secondary | ICD-10-CM | POA: Diagnosis not present

## 2022-04-01 DIAGNOSIS — Z1211 Encounter for screening for malignant neoplasm of colon: Secondary | ICD-10-CM | POA: Diagnosis not present

## 2022-04-22 DIAGNOSIS — E785 Hyperlipidemia, unspecified: Secondary | ICD-10-CM | POA: Diagnosis not present

## 2022-04-22 DIAGNOSIS — E669 Obesity, unspecified: Secondary | ICD-10-CM | POA: Diagnosis not present

## 2022-04-22 DIAGNOSIS — Z125 Encounter for screening for malignant neoplasm of prostate: Secondary | ICD-10-CM | POA: Diagnosis not present

## 2022-04-24 DIAGNOSIS — G4733 Obstructive sleep apnea (adult) (pediatric): Secondary | ICD-10-CM | POA: Diagnosis not present

## 2022-04-29 DIAGNOSIS — Z0000 Encounter for general adult medical examination without abnormal findings: Secondary | ICD-10-CM | POA: Diagnosis not present

## 2022-04-29 DIAGNOSIS — E782 Mixed hyperlipidemia: Secondary | ICD-10-CM | POA: Diagnosis not present

## 2022-04-29 DIAGNOSIS — Z23 Encounter for immunization: Secondary | ICD-10-CM | POA: Diagnosis not present

## 2022-04-29 DIAGNOSIS — Z1331 Encounter for screening for depression: Secondary | ICD-10-CM | POA: Diagnosis not present

## 2022-05-24 DIAGNOSIS — G4733 Obstructive sleep apnea (adult) (pediatric): Secondary | ICD-10-CM | POA: Diagnosis not present

## 2022-06-24 DIAGNOSIS — G4733 Obstructive sleep apnea (adult) (pediatric): Secondary | ICD-10-CM | POA: Diagnosis not present

## 2022-07-25 DIAGNOSIS — G4733 Obstructive sleep apnea (adult) (pediatric): Secondary | ICD-10-CM | POA: Diagnosis not present

## 2022-08-24 DIAGNOSIS — G4733 Obstructive sleep apnea (adult) (pediatric): Secondary | ICD-10-CM | POA: Diagnosis not present

## 2022-09-15 NOTE — Progress Notes (Signed)
[facility] [place], [place] [address]  Pulmonary Sleep Medicine   Office Visit Note  Patient Name: Bryan Sherman DOB: [birth year] MRN [id number]    Chief Complaint: Obstructive Sleep Apnea visit  Brief History:  Adoni is seen today for an annual follow up visit for CPAP@ 13 cmH2O. The patient has a 10 year history of sleep apnea. Patient is using PAP nightly.  The patient feels rested after sleeping with PAP.  The patient reports benefiting from PAP use. Reported sleepiness is improved and the Epworth Sleepiness Score is 2 out of 24. The patient does take an hour nap 2-3 times per week. The patient complains of the following: no complaints with his therapy.  The compliance download shows 96% compliance with an average use time of 10 hours 3 minutes. The AHI is 2.8.  The patient does not complain of limb movements disrupting sleep. The patient continues to require PAP therapy in order to eliminate sleep apnea. Patient does report he did a trial of doxepin to help with insomnia and it worked very well for him and plans to discuss with PCP to obtain script to continue this.  ROS  General: (-) fever, (-) chills, (-) night sweat Nose and Sinuses: (-) nasal stuffiness or itchiness, (-) postnasal drip, (-) nosebleeds, (-) sinus trouble. Mouth and Throat: (-) sore throat, (-) hoarseness. Neck: (-) swollen glands, (-) enlarged thyroid, (-) neck pain. Respiratory: - cough, - shortness of breath, - wheezing. Neurologic: - numbness, - tingling. Psychiatric: - anxiety, - depression   Current Medication: Outpatient Encounter Medications as of 09/16/2022  Medication Sig   citalopram (CELEXA) 20 MG tablet Take 1 tablet (20 mg total) by mouth daily.   loratadine (CLARITIN) 10 MG tablet Take 10 mg by mouth daily.   NON FORMULARY CPAP @@17  cm H20   No facility-administered encounter medications on file as of 09/16/2022.    Surgical History: Past Surgical History:   Procedure Laterality Date   SHOULDER ARTHROSCOPY Left 2006    Medical History: Past Medical History:  Diagnosis Date   Ankle fracture 2017   left   Depression, major, single episode, in partial remission ([place]) 04/11/2015   Excessive body weight gain 04/11/2015    Family History: Non contributory to the present illness  Social History: Social History   Socioeconomic History   Marital status: Married    Spouse name: Not on file   Number of children: 1   Years of education: Not on file   Highest education level: Not on file  Occupational History   Not on file  Tobacco Use   Smoking status: Former   Smokeless tobacco: Current    Types: Chew  Substance and Sexual Activity   Alcohol use: Yes    Alcohol/week: 4.0 standard drinks of alcohol    Types: 4 Standard drinks or equivalent per week    Comment: Social   Drug use: No   Sexual activity: Not on file  Other Topics Concern   Not on file  Social History Narrative   Not on file   Social Determinants of Health   Financial Resource Strain: Not on file  Food Insecurity: Not on file  Transportation Needs: Not on file  Physical Activity: Not on file  Stress: Not on file  Social Connections: Not on file  Intimate Partner Violence: Not on file    Vital Signs: Blood pressure 109/88, pulse 75, resp. rate 12, height 5\' 6"  (1.676 m), weight 226 lb (102.5  kg), SpO2 95 %. Body mass index is 36.48 kg/m.    Examination: General Appearance: The patient is well-developed, well-nourished, and in no distress. Neck Circumference: 45cm Skin: Gross inspection of skin unremarkable. Head: normocephalic, no gross deformities. Eyes: no gross deformities noted. ENT: ears appear grossly normal Neurologic: Alert and oriented. No involuntary movements.  STOP BANG RISK ASSESSMENT S (snore) Have you been told that you snore?     No   T (tired) Are you often tired, fatigued, or sleepy during the day?   NO  O (obstruction) Do you stop  breathing, choke, or gasp during sleep? NO   P (pressure) Do you have or are you being treated for high blood pressure? YES   B (BMI) Is your body index greater than 35 kg/m? YES   A (age) Are you 50 years old or older? YES   N (neck) Do you have a neck circumference greater than 16 inches?   YES   G (gender) Are you a male? YES   TOTAL STOP/BANG "YES" ANSWERS 5       A STOP-Bang score of 2 or less is considered low risk, and a score of 5 or more is high risk for having either moderate or severe OSA. For people who score 3 or 4, doctors may need to perform further assessment to determine how likely they are to have OSA.         EPWORTH SLEEPINESS SCALE:  Scale:  (0)= no chance of dozing; (1)= slight chance of dozing; (2)= moderate chance of dozing; (3)= high chance of dozing  Chance  Situtation    Sitting and reading: 0    Watching TV: 0    Sitting Inactive in public: 0    As a passenger in car: 2      Lying down to rest: 0    Sitting and talking: 0    Sitting quielty after lunch: 0    In a car, stopped in traffic: 0   TOTAL SCORE:   2 out of 24    SLEEP STUDIES:  PSG (11/2010) AHI 22/hr, min SpO2 88% Titration (12/2010) CPAP@ 13 cmH2O   CPAP COMPLIANCE DATA:  Date Range: 09/15/2021-09/14/2022  Average Daily Use: 10 hours 7 minutes  Median Use: 9 hours 54 minutes  Compliance for > 4 Hours: 96%  AHI: 2.8 respiratory events per hour  Days Used: 363/365 days  Mask Leak: 21.5  95th Percentile Pressure: 13         LABS: No results found for this or any previous visit (from the past 2160 hour(s)).  Radiology: US Abdomen Complete  Result Date: 12/17/2011 * PRIOR REPORT IMPORTED FROM AN EXTERNAL SYSTEM * PRIOR REPORT IMPORTED FROM THE SYNGO WORKFLOW SYSTEM REASON FOR EXAM:    complete  abn LFTs COMMENTS: PROCEDURE:     MUS - MUS ABDOMEN GENERAL SURVEY  - Dec 17 2011 10:42AM RESULT:     The liver is hyperechogenic consistent with fatty  infiltration. No focal hepatic mass is seen. The body of the pancreas is visualized and is normal in appearance. The head and tail are obscured by bowel gas. Spleen size is normal. The abdominal aorta is not visualized adequately for evaluation. The proximal portion of the inferior vena cava is visualized and is normal in appearance. No gallstones are seen. There is no thickening of the gallbladder wall. Common bile duct measures 3.2 mm in diameter which is within normal limits. The kidneys show no hydronephrosis. No ascites  is seen. IMPRESSION: 1. The liver appears hyperechogenic, suspicious for fatty infiltration. 2. No gallstones are seen. 3. The pancreas is partially obscured. 4. The abdominal aorta is not visualized on this exam and is apparently obscured by bowel gas. Thank you for the opportunity to contribute to the care of your patient.     No results found.  No results found.    Assessment and Plan: Patient Active Problem List   Diagnosis Date Noted   Major depressive disorder, single episode in full remission (HCC) 08/24/2018   Hyperlipidemia, mild 08/24/2018   Abnormal LFTs 04/11/2015   Obstructive apnea 04/11/2015      The patient does tolerate PAP and reports benefit from PAP use. The patient was reminded how to adjust mask fit and advised to change supplies regularly. The patient was also counselled on nightly use. The compliance is excellent. The AHI is 2.8. Pt continues to require cpap to treat his osa and is medically necessary.   1. Obstructive apnea Continue excellent compliance  2. CPAP use counseling CPAP couseling-Discussed importance of adequate CPAP use as well as proper care and cleaning techniques of machine and all supplies.  3. Major depressive disorder, single episode in full remission (HCC) Continue current medication and f/u with PCP.  4. Obesity (BMI 30-39.9) Obesity Counseling: Had a lengthy discussion regarding patients BMI and weight issues.  Patient was instructed on portion control as well as increased activity. Also discussed caloric restrictions with trying to maintain intake less than 2000 Kcal. Discussions were made in accordance with the 5As of weight management. Simple actions such as not eating late and if able to, taking a walk is suggested.    General Counseling: I have discussed the findings of the evaluation and examination with Normand Sloop.  I have also discussed any further diagnostic evaluation thatmay be needed or ordered today. Padraig verbalizes understanding of the findings of todays visit. We also reviewed his medications today and discussed drug interactions and side effects including but not limited excessive drowsiness and altered mental states. We also discussed that there is always a risk not just to him but also people around him. he has been encouraged to call the office with any questions or concerns that should arise related to todays visit.  No orders of the defined types were placed in this encounter.       I have personally obtained a history, examined the patient, evaluated laboratory and imaging results, formulated the assessment and plan and placed orders.  This patient was seen by Lynn Ito, PA-C in collaboration with Dr. Freda Munro as a part of collaborative care agreement.  Yevonne Pax, MD [facility] Diplomate ABMS Pulmonary Critical Care Medicine and Sleep Medicine

## 2022-09-16 ENCOUNTER — Ambulatory Visit (INDEPENDENT_AMBULATORY_CARE_PROVIDER_SITE_OTHER): Payer: Federal, State, Local not specified - PPO | Admitting: Internal Medicine

## 2022-09-16 VITALS — BP 109/88 | HR 75 | Resp 12 | Ht 66.0 in | Wt 226.0 lb

## 2022-09-16 DIAGNOSIS — F325 Major depressive disorder, single episode, in full remission: Secondary | ICD-10-CM | POA: Diagnosis not present

## 2022-09-16 DIAGNOSIS — E669 Obesity, unspecified: Secondary | ICD-10-CM | POA: Diagnosis not present

## 2022-09-16 DIAGNOSIS — G4733 Obstructive sleep apnea (adult) (pediatric): Secondary | ICD-10-CM | POA: Diagnosis not present

## 2022-09-16 DIAGNOSIS — Z7189 Other specified counseling: Secondary | ICD-10-CM | POA: Diagnosis not present

## 2022-09-16 NOTE — Patient Instructions (Signed)

## 2022-09-21 ENCOUNTER — Other Ambulatory Visit: Payer: Self-pay | Admitting: Internal Medicine

## 2022-09-21 ENCOUNTER — Encounter: Payer: Self-pay | Admitting: Internal Medicine

## 2022-09-21 DIAGNOSIS — M720 Palmar fascial fibromatosis [Dupuytren]: Secondary | ICD-10-CM | POA: Insufficient documentation

## 2022-09-24 DIAGNOSIS — G4733 Obstructive sleep apnea (adult) (pediatric): Secondary | ICD-10-CM | POA: Diagnosis not present

## 2022-10-07 DIAGNOSIS — Z1211 Encounter for screening for malignant neoplasm of colon: Secondary | ICD-10-CM | POA: Diagnosis not present

## 2022-10-07 DIAGNOSIS — D123 Benign neoplasm of transverse colon: Secondary | ICD-10-CM | POA: Diagnosis not present

## 2022-10-07 DIAGNOSIS — E785 Hyperlipidemia, unspecified: Secondary | ICD-10-CM | POA: Diagnosis not present

## 2022-10-07 DIAGNOSIS — K648 Other hemorrhoids: Secondary | ICD-10-CM | POA: Diagnosis not present

## 2022-10-07 DIAGNOSIS — Z6837 Body mass index (BMI) 37.0-37.9, adult: Secondary | ICD-10-CM | POA: Diagnosis not present

## 2022-10-07 DIAGNOSIS — D122 Benign neoplasm of ascending colon: Secondary | ICD-10-CM | POA: Diagnosis not present

## 2022-10-07 DIAGNOSIS — D124 Benign neoplasm of descending colon: Secondary | ICD-10-CM | POA: Diagnosis not present

## 2022-10-07 DIAGNOSIS — E669 Obesity, unspecified: Secondary | ICD-10-CM | POA: Diagnosis not present

## 2022-10-07 DIAGNOSIS — G4733 Obstructive sleep apnea (adult) (pediatric): Secondary | ICD-10-CM | POA: Diagnosis not present

## 2022-10-07 DIAGNOSIS — F17220 Nicotine dependence, chewing tobacco, uncomplicated: Secondary | ICD-10-CM | POA: Diagnosis not present

## 2022-10-07 DIAGNOSIS — D369 Benign neoplasm, unspecified site: Secondary | ICD-10-CM | POA: Diagnosis not present

## 2022-10-07 DIAGNOSIS — Z79899 Other long term (current) drug therapy: Secondary | ICD-10-CM | POA: Diagnosis not present

## 2022-10-22 DIAGNOSIS — F5101 Primary insomnia: Secondary | ICD-10-CM | POA: Diagnosis not present

## 2022-10-24 DIAGNOSIS — G4733 Obstructive sleep apnea (adult) (pediatric): Secondary | ICD-10-CM | POA: Diagnosis not present

## 2023-09-14 NOTE — Progress Notes (Unsigned)
[facility] [address] [place], [place] [address]  Pulmonary Sleep Medicine   Office Visit Note  Patient Name: Bryan Sherman DOB: 06-17-1971 MRN [id number]    Chief Complaint: Obstructive Sleep Apnea visit  Brief History:  Bryan Sherman is seen today for an annual follow up visit for CPAP@ 13 cmH2O. The patient has a 11 year history of sleep apnea. Patient is using PAP nightly.  The patient feels rested after sleeping with PAP.  The patient reports benefiting from PAP use. Reported sleepiness is  improved and the Epworth Sleepiness Score is 4 out of 24. The patient will occasionally take naps. The patient complains of the following: none.  The compliance download shows 98% compliance with an average use time of 10 hours 2 minutes. The AHI is 4.1.  The patient does not complain of limb movements disrupting sleep. The patient continues to require PAP therapy in order to eliminate sleep apnea.   ROS  General: (-) fever, (-) chills, (-) night sweat Nose and Sinuses: (-) nasal stuffiness or itchiness, (-) postnasal drip, (-) nosebleeds, (-) sinus trouble. Mouth and Throat: (-) sore throat, (-) hoarseness. Neck: (-) swollen glands, (-) enlarged thyroid, (-) neck pain. Respiratory: - cough, - shortness of breath, - wheezing. Neurologic: - numbness, - tingling. Psychiatric: - anxiety, - depression   Current Medication: Outpatient Encounter Medications as of 09/15/2023  Medication Sig   citalopram (CELEXA) 20 MG tablet Take 1 tablet (20 mg total) by mouth daily.   doxepin (SINEQUAN) 10 MG capsule Take by mouth.   loratadine (CLARITIN) 10 MG tablet Take 10 mg by mouth daily.   NON FORMULARY CPAP @@17  cm H20   No facility-administered encounter medications on file as of 09/15/2023.    Surgical History: Past Surgical History:  Procedure Laterality Date   SHOULDER ARTHROSCOPY Left 2006    Medical History: Past Medical History:  Diagnosis Date   Ankle fracture 2017    left   Depression, major, single episode, in partial remission (HCC) 04/11/2015   Excessive body weight gain 04/11/2015    Family History: Non contributory to the present illness  Social History: Social History   Socioeconomic History   Marital status: Married    Spouse name: Not on file   Number of children: 1   Years of education: Not on file   Highest education level: Not on file  Occupational History   Not on file  Tobacco Use   Smoking status: Former   Smokeless tobacco: Current    Types: Chew  Substance and Sexual Activity   Alcohol use: Yes    Alcohol/week: 4.0 standard drinks of alcohol    Types: 4 Standard drinks or equivalent per week    Comment: Social   Drug use: No   Sexual activity: Not on file  Other Topics Concern   Not on file  Social History Narrative   Not on file   Social Determinants of Health   Financial Resource Strain: Not on file  Food Insecurity: Not on file  Transportation Needs: Not on file  Physical Activity: Not on file  Stress: Not on file  Social Connections: Not on file  Intimate Partner Violence: Not on file    Vital Signs: Blood pressure 113/73, pulse 81, resp. rate 16, height 5\' 6"  (1.676 m), weight 189 lb (85.7 kg), SpO2 98%. Body mass index is 30.51 kg/m.    Examination: General Appearance: The patient is well-developed, well-nourished, and in no distress. Neck Circumference: 45 cm Skin:  Gross inspection of skin unremarkable. Head: normocephalic, no gross deformities. Eyes: no gross deformities noted. ENT: ears appear grossly normal Neurologic: Alert and oriented. No involuntary movements.  STOP BANG RISK ASSESSMENT S (snore) Have you been told that you snore?     NO   T (tired) Are you often tired, fatigued, or sleepy during the day?   NO  O (obstruction) Do you stop breathing, choke, or gasp during sleep? NO   P (pressure) Do you have or are you being treated for high blood pressure? YES   B (BMI) Is your body  index greater than 35 kg/m? YES   A (age) Are you 50 years old or older? YES   N (neck) Do you have a neck circumference greater than 16 inches?   YES   G (gender) Are you a male? YES   TOTAL STOP/BANG "YES" ANSWERS 5       A STOP-Bang score of 2 or less is considered low risk, and a score of 5 or more is high risk for having either moderate or severe OSA. For people who score 3 or 4, doctors may need to perform further assessment to determine how likely they are to have OSA.         EPWORTH SLEEPINESS SCALE:  Scale:  (0)= no chance of dozing; (1)= slight chance of dozing; (2)= moderate chance of dozing; (3)= high chance of dozing  Chance  Situtation    Sitting and reading: 0    Watching TV: 0    Sitting Inactive in public: 0    As a passenger in car: 2      Lying down to rest: 2    Sitting and talking: 0    Sitting quielty after lunch: 0    In a car, stopped in traffic: 0   TOTAL SCORE:   4 out of 24    SLEEP STUDIES:  PSG (11/2010) AHI 22/hr, min SpO2 88% Titration (12/2010) CPAP@ 13 cmH2O   CPAP COMPLIANCE DATA:  Date Range: 09/14/2022-09/13/2023  Average Daily Use: 10 hours 2 minutes  Median Use: 10 hours 5 minutes  Compliance for > 4 Hours: 98%  AHI: 4.1 respiratory events per hour  Days Used: 361/365 days  Mask Leak: 22.9  95th Percentile Pressure: 13         LABS: No results found for this or any previous visit (from the past 2160 hour(s)).  Radiology: US Abdomen Complete  Result Date: 12/17/2011 PRIOR REPORT IMPORTED FROM THE SYNGO WORKFLOW SYSTEM REASON FOR EXAM: complete  abn LFTs COMMENTS: PROCEDURE: MUS - MUS ABDOMEN GENERAL SURVEY Dec 17 2011 10:42AM RESULT: The liver is hyperechogenic consistent with fatty infiltration. No focal hepatic mass is seen. The body of the pancreas is visualized and is normal in appearance. The head and tail are obscured by bowel gas. Spleen size is normal. The abdominal aorta is not visualized  adequately for evaluation. The proximal portion of the inferior vena cava is visualized and is normal in appearance. No gallstones are seen. There is no thickening of the gallbladder wall. Common bile duct measures 3.2 mm in diameter which is within normal limits. The kidneys show no hydronephrosis. No ascites is seen. IMPRESSION: 1. The liver appears hyperechogenic, suspicious for fatty infiltration. 2. No gallstones are seen. 3. The pancreas is partially obscured. 4. The abdominal aorta is not visualized on this exam and is apparently obscured by bowel gas. Thank you for the opportunity to contribute to the care of  your patient.     No results found.  No results found.    Assessment and Plan: Patient Active Problem List   Diagnosis Date Noted   Contracture of palmar fascia 09/21/2022   Major depressive disorder, single episode in full remission (HCC) 08/24/2018   Hyperlipidemia, mild 08/24/2018   Abnormal LFTs 04/11/2015   Obstructive apnea 04/11/2015      The patient does tolerate PAP and reports benefit from PAP use. The patient was reminded how to adjust mask fit and advised to change supplies. The patient was also counselled on nightly use. The compliance is excellent. The AHI is 4.1. Will adjust to CPAP @11  due to weight loss and check remote download in 2 weeks. Patient continues to require PAP to treat their apnea and is medically necessary.   1. Obstructive apnea Continue excellent compliance  2. CPAP use counseling CPAP couseling-Discussed importance of adequate CPAP use as well as proper care and cleaning techniques of machine and all supplies.  3. Major depressive disorder, single episode in full remission (HCC) Continue current medication and f/u with PCP.  4. Obesity (BMI 30-39.9) Obesity Counseling: Had a lengthy discussion regarding patients BMI and weight issues. Patient was instructed on portion control as well as increased activity. Also discussed caloric  restrictions with trying to maintain intake less than 2000 Kcal. Discussions were made in accordance with the 5As of weight management. Simple actions such as not eating late and if able to, taking a walk is suggested.    General Counseling: I have discussed the findings of the evaluation and examination with Bryan Sherman.  I have also discussed any further diagnostic evaluation thatmay be needed or ordered today. Bryan Sherman verbalizes understanding of the findings of todays visit. We also reviewed his medications today and discussed drug interactions and side effects including but not limited excessive drowsiness and altered mental states. We also discussed that there is always a risk not just to him but also people around him. he has been encouraged to call the office with any questions or concerns that should arise related to todays visit.  No orders of the defined types were placed in this encounter.       I have personally obtained a history, examined the patient, evaluated laboratory and imaging results, formulated the assessment and plan and placed orders.  This patient was seen by Lynn Ito, PA-C in collaboration with Dr. Freda Munro as a part of collaborative care agreement.  Yevonne Pax, MD [place] Diplomate ABMS Pulmonary Critical Care Medicine and Sleep Medicine

## 2023-09-15 ENCOUNTER — Ambulatory Visit (INDEPENDENT_AMBULATORY_CARE_PROVIDER_SITE_OTHER): Payer: Federal, State, Local not specified - PPO | Admitting: Internal Medicine

## 2023-09-15 VITALS — BP 113/73 | HR 81 | Resp 16 | Ht 66.0 in | Wt 189.0 lb

## 2023-09-15 DIAGNOSIS — Z7189 Other specified counseling: Secondary | ICD-10-CM | POA: Diagnosis not present

## 2023-09-15 DIAGNOSIS — F325 Major depressive disorder, single episode, in full remission: Secondary | ICD-10-CM

## 2023-09-15 DIAGNOSIS — E669 Obesity, unspecified: Secondary | ICD-10-CM | POA: Diagnosis not present

## 2023-09-15 DIAGNOSIS — G4733 Obstructive sleep apnea (adult) (pediatric): Secondary | ICD-10-CM

## 2023-09-15 NOTE — Patient Instructions (Signed)

## 2024-07-05 NOTE — Progress Notes (Signed)
 [facility] [address] [place], [place] [address]  Pulmonary Sleep Medicine   Office Visit Note  Patient Name: Bryan Sherman DOB: Feb 24, 1971 MRN [id number]    Chief Complaint: Obstructive Sleep Apnea visit  Brief History:  Tomy is seen today for an annual follow up on CPAP @ 11 cmH2O. The patient has a 12 year history of sleep apnea. Patient is using PAP nightly.  The patient feels somewhat rested after sleeping with PAP, due to anxiety and depression keeping him awake.  The patient reports benefit from PAP use. Reported sleepiness is improved and the Epworth Sleepiness Score is 8 out of 24. The patient does take naps with his CPAP for 30 minutes to 2 hours.. The patient complains of the following: trouble initiating sleep, sometimes take hours to fall asleep. The compliance download shows 96% compliance with an average use time of 90 hours 19 minutes. The AHI is 5.5  The patient does not complain of limb movements disrupting sleep. Patient states that his depression has worsened in the last 6 months, which he is being treated for. He states psychiatry recently started him on mirtazapine and this helps him get to sleep. Patient reports he has a letter for the [place] to process claim based on his OSA. 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  ROS  General: (-) fever, (-) chills, (-) night sweat Nose and Sinuses: (-) nasal stuffiness or itchiness, (-) postnasal drip, (-) nosebleeds, (-) sinus trouble. Mouth and Throat: (-) sore throat, (-) hoarseness. Neck: (-) swollen glands, (-) enlarged thyroid, (-) neck pain. Respiratory: - cough, - shortness of breath, - wheezing. Neurologic: - numbness, - tingling. Psychiatric: + anxiety, + depression   Current Medication: Outpatient Encounter Medications as of 07/06/2024  Medication Sig   mirtazapine (REMERON) 7.5 MG tablet  Take 15 mg by mouth.   citalopram  (CELEXA ) 20 MG tablet Take 1 tablet (20 mg total) by mouth daily.   loratadine (CLARITIN) 10 MG tablet Take 10 mg by mouth daily.   NON FORMULARY CPAP @@17  cm H20   [DISCONTINUED] doxepin (SINEQUAN) 10 MG capsule Take by mouth.   No facility-administered encounter medications on file as of 07/06/2024.    Surgical History: Past Surgical History:  Procedure Laterality Date   SHOULDER ARTHROSCOPY Left 2006    Medical History: Past Medical History:  Diagnosis Date   Ankle fracture 2017   left   Depression, major, single episode, in partial remission (HCC) 04/11/2015   Excessive body weight gain 04/11/2015    Family History: Non contributory to the present illness  Social History: Social History   Socioeconomic History   Marital status: Married    Spouse name: Not on file   Number of children: 1   Years of education: Not on file   Highest education level: Not on file  Occupational History   Not on file  Tobacco Use   Smoking status: Former   Smokeless tobacco: Current    Types: Chew  Substance and Sexual Activity   Alcohol use: Yes    Alcohol/week: 4.0 standard drinks of alcohol    Types: 4 Standard drinks or equivalent per week    Comment: Social   Drug use: No   Sexual activity: Not on file  Other Topics Concern   Not on file  Social History Narrative   Not on file   Social Drivers of  Health   Financial Resource Strain: Not on file  Food Insecurity: Not on file  Transportation Needs: Not on file  Physical Activity: Not on file  Stress: Not on file  Social Connections: Not on file  Intimate Partner Violence: Not on file    Vital Signs: Blood pressure 124/77, pulse 65, resp. rate 16, height 5' 6 (1.676 m), weight 193 lb (87.5 kg), SpO2 98%. Body mass index is 31.15 kg/m.    Examination: General Appearance: The patient is well-developed, well-nourished, and in no distress. Neck Circumference: 36 cm Skin: Gross  inspection of skin unremarkable. Head: normocephalic, no gross deformities. Eyes: no gross deformities noted. ENT: ears appear grossly normal Neurologic: Alert and oriented. No involuntary movements.  STOP BANG RISK ASSESSMENT S (snore) Have you been told that you snore?     NO   T (tired) Are you often tired, fatigued, or sleepy during the day?   YES  O (obstruction) Do you stop breathing, choke, or gasp during sleep? NO   P (pressure) Do you have or are you being treated for high blood pressure? NO   B (BMI) Is your body index greater than 35 kg/m? NO   A (age) Are you 52 years old or older? YES   N (neck) Do you have a neck circumference greater than 16 inches?   NO   G (gender) Are you a male? YES   TOTAL STOP/BANG "YES" ANSWERS 3       A STOP-Bang score of 2 or less is considered low risk, and a score of 5 or more is high risk for having either moderate or severe OSA. For people who score 3 or 4, doctors may need to perform further assessment to determine how likely they are to have OSA.         EPWORTH SLEEPINESS SCALE:  Scale:  (0)= no chance of dozing; (1)= slight chance of dozing; (2)= moderate chance of dozing; (3)= high chance of dozing  Chance  Situtation    Sitting and reading: 1    Watching TV: 1    Sitting Inactive in public: 0    As a passenger in car: 2      Lying down to rest: 3    Sitting and talking: 0    Sitting quielty after lunch: 1    In a car, stopped in traffic: 0   TOTAL SCORE:   8 out of 24    SLEEP STUDIES:  PSG (11/2010) AHI 22/hr , Min Sp02 88% Titration (12/2010) CPAP@ 13 cmH20   CPAP COMPLIANCE DATA:  Date Range: 07/03/2023 - 07/01/2024  Average Daily Use: 9 hours 25 minutes  Median Use: 9 hours 8 minutes  Compliance for > 4 Hours: 96% days  AHI: 5.5 respiratory events per hour  Days Used: 361/365  Mask Leak: 24.9  95th Percentile Pressure: 11 cmH2O         LABS: No results found for this or any  previous visit (from the past 2160 hours).  Radiology: US  Abdomen Complete Result Date: 12/17/2011 PRIOR REPORT IMPORTED FROM AN EXTERNAL SYSTEM PRIOR REPORT IMPORTED FROM THE SYNGO WORKFLOW SYSTEM REASON FOR EXAM:    complete  abn LFTs COMMENTS: PROCEDURE:     MUS - MUS ABDOMEN GENERAL SURVEY  - Dec 17 2011 10:42AM RESULT:     The liver is hyperechogenic consistent with fatty infiltration. No focal hepatic mass is seen. The body of the pancreas is visualized and is normal in appearance. The  head and tail are obscured by bowel gas. Spleen size is normal. The abdominal aorta is not visualized adequately for evaluation. The proximal portion of the inferior vena cava is visualized and is normal in appearance. No gallstones are seen. There is no thickening of the gallbladder wall. Common bile duct measures 3.2 mm in diameter which is within normal limits. The kidneys show no hydronephrosis. No ascites is seen. IMPRESSION: 1. The liver appears hyperechogenic, suspicious for fatty infiltration. 2. No gallstones are seen. 3. The pancreas is partially obscured. 4. The abdominal aorta is not visualized on this exam and is apparently obscured by bowel gas. Thank you for the opportunity to contribute to the care of your patient.     No results found.  No results found.    Assessment and Plan: Patient Active Problem List   Diagnosis Date Noted   Contracture of palmar fascia 09/21/2022   Major depressive disorder, single episode in full remission (HCC) 08/24/2018   Hyperlipidemia, mild 08/24/2018   Abnormal LFTs 04/11/2015   Obstructive apnea 04/11/2015      The patient does tolerate PAP and reports benefit from PAP use. The patient was reminded how to adjust mask fit and advised to change supplies regularly. The patient was also counselled on nightly use. The compliance is excellent. The AHI is 5.5. Will raise CPAP to 12cm H2O to help better control apnea and check 2 week remote download. Patient  continues to require PAP to treat their apnea and is medically necessary.   1. Obstructive apnea (Primary) Continue excellent compliance  2. CPAP use counseling CPAP couseling-Discussed importance of adequate CPAP use as well as proper care and cleaning techniques of machine and all supplies.  3. Major depressive disorder, single episode in full remission (HCC) Followed by psychiatry  4. Obesity (BMI 30-39.9) Obesity Counseling: Had a lengthy discussion regarding patients BMI and weight issues. Patient was instructed on portion control as well as increased activity. Also discussed caloric restrictions with trying to maintain intake less than 2000 Kcal. Discussions were made in accordance with the 5As of weight management. Simple actions such as not eating late and if able to, taking a walk is suggested.    General Counseling: I have discussed the findings of the evaluation and examination with Ricardo.  I have also discussed any further diagnostic evaluation thatmay be needed or ordered today. Dong verbalizes understanding of the findings of todays visit. We also reviewed his medications today and discussed drug interactions and side effects including but not limited excessive drowsiness and altered mental states. We also discussed that there is always a risk not just to him but also people around him. he has been encouraged to call the office with any questions or concerns that should arise related to todays visit.  No orders of the defined types were placed in this encounter.       I have personally obtained a history, examined the patient, evaluated laboratory and imaging results, formulated the assessment and plan and placed orders.  This patient was seen by Tinnie Pro, PA-C in collaboration with Dr. Elfreda Bathe as a part of collaborative care agreement.  Elfreda DELENA Bathe, MD [facility] Diplomate ABMS Pulmonary Critical Care Medicine and Sleep Medicine

## 2024-07-06 ENCOUNTER — Ambulatory Visit (INDEPENDENT_AMBULATORY_CARE_PROVIDER_SITE_OTHER): Admitting: Internal Medicine

## 2024-07-06 VITALS — BP 124/77 | HR 65 | Resp 16 | Ht 66.0 in | Wt 193.0 lb

## 2024-07-06 DIAGNOSIS — F325 Major depressive disorder, single episode, in full remission: Secondary | ICD-10-CM

## 2024-07-06 DIAGNOSIS — G4733 Obstructive sleep apnea (adult) (pediatric): Secondary | ICD-10-CM

## 2024-07-06 DIAGNOSIS — Z7189 Other specified counseling: Secondary | ICD-10-CM

## 2024-07-06 DIAGNOSIS — E669 Obesity, unspecified: Secondary | ICD-10-CM | POA: Diagnosis not present

## 2024-07-06 NOTE — Patient Instructions (Signed)
